# Patient Record
Sex: Male | Born: 1980 | Race: Black or African American | Hispanic: No | Marital: Single | Smoking: Current every day smoker
Health system: Southern US, Community
[De-identification: ages and names within clinical notes are randomized; demographics above are authoritative.]

## PROBLEM LIST (undated history)

## (undated) DIAGNOSIS — R519 Headache, unspecified: Secondary | ICD-10-CM

## (undated) DIAGNOSIS — R51 Headache: Secondary | ICD-10-CM

## (undated) HISTORY — PX: OTHER SURGICAL HISTORY: SHX169

## (undated) HISTORY — DX: Headache, unspecified: R51.9

## (undated) HISTORY — DX: Headache: R51

---

## 2013-10-18 ENCOUNTER — Encounter (HOSPITAL_BASED_OUTPATIENT_CLINIC_OR_DEPARTMENT_OTHER): Payer: Self-pay | Admitting: Emergency Medicine

## 2013-10-18 DIAGNOSIS — R300 Dysuria: Secondary | ICD-10-CM | POA: Insufficient documentation

## 2013-10-18 DIAGNOSIS — F17200 Nicotine dependence, unspecified, uncomplicated: Secondary | ICD-10-CM | POA: Insufficient documentation

## 2013-10-18 DIAGNOSIS — Z202 Contact with and (suspected) exposure to infections with a predominantly sexual mode of transmission: Secondary | ICD-10-CM | POA: Insufficient documentation

## 2013-10-18 NOTE — ED Notes (Signed)
Reports "std" symptoms, pain with urination, milky discharge

## 2013-10-19 ENCOUNTER — Emergency Department (HOSPITAL_BASED_OUTPATIENT_CLINIC_OR_DEPARTMENT_OTHER)
Admission: EM | Admit: 2013-10-19 | Discharge: 2013-10-19 | Disposition: A | Discharge status: Home / Self Care | Payer: Commercial Managed Care - PPO | Attending: Emergency Medicine | Admitting: Emergency Medicine

## 2013-10-19 DIAGNOSIS — Z202 Contact with and (suspected) exposure to infections with a predominantly sexual mode of transmission: Secondary | ICD-10-CM

## 2013-10-19 DIAGNOSIS — R300 Dysuria: Secondary | ICD-10-CM

## 2013-10-19 LAB — URINE MICROSCOPIC-ADD ON

## 2013-10-19 LAB — URINALYSIS, ROUTINE W REFLEX MICROSCOPIC
BILIRUBIN URINE: NEGATIVE
Glucose, UA: NEGATIVE mg/dL
Ketones, ur: NEGATIVE mg/dL
Nitrite: NEGATIVE
PROTEIN: NEGATIVE mg/dL
Specific Gravity, Urine: 1.033 — ABNORMAL HIGH (ref 1.005–1.030)
UROBILINOGEN UA: 1 mg/dL (ref 0.0–1.0)
pH: 6 (ref 5.0–8.0)

## 2013-10-19 LAB — GC/CHLAMYDIA PROBE AMP
CT Probe RNA: NEGATIVE
GC Probe RNA: NEGATIVE

## 2013-10-19 MED ORDER — AZITHROMYCIN 250 MG PO TABS
ORAL_TABLET | ORAL | Status: AC
  Filled 2013-10-19: qty 4

## 2013-10-19 MED ORDER — AZITHROMYCIN 250 MG PO TABS
1000.0000 mg | ORAL_TABLET | Freq: Once | ORAL | Status: AC
  Administered 2013-10-19: 1000 mg via ORAL

## 2013-10-19 MED ORDER — CEFTRIAXONE SODIUM 250 MG IJ SOLR
250.0000 mg | Freq: Once | INTRAMUSCULAR | Status: AC
  Administered 2013-10-19: 250 mg via INTRAMUSCULAR

## 2013-10-19 MED ORDER — CEFTRIAXONE SODIUM 250 MG IJ SOLR
INTRAMUSCULAR | Status: AC
  Filled 2013-10-19: qty 250

## 2013-10-19 NOTE — Discharge Instructions (Signed)
We will call you if your cultures indicate that you require further treatment.  Return to the emergency department if you develop worsening or different and concerning symptoms.   Dysuria Dysuria is the medical term for pain with urination. There are many causes for dysuria, but urinary tract infection is the most common. If a urinalysis was performed it can show that there is a urinary tract infection. A urine culture confirms that you or your child is sick. You will need to follow up with a healthcare provider because:  If a urine culture was done you will need to know the culture results and treatment recommendations.  If the urine culture was positive, you or your child will need to be put on antibiotics or know if the antibiotics prescribed are the right antibiotics for your urinary tract infection.  If the urine culture is negative (no urinary tract infection), then other causes may need to be explored or antibiotics need to be stopped. Today laboratory work may have been done and there does not seem to be an infection. If cultures were done they will take at least 24 to 48 hours to be completed. Today x-rays may have been taken and they read as normal. No cause can be found for the problems. The x-rays may be re-read by a radiologist and you will be contacted if additional findings are made. You or your child may have been put on medications to help with this problem until you can see your primary caregiver. If the problems get better, see your primary caregiver if the problems return. If you were given antibiotics (medications which kill germs), take all of the mediations as directed for the full course of treatment.  If laboratory work was done, you need to find the results. Leave a telephone number where you can be reached. If this is not possible, make sure you find out how you are to get test results. HOME CARE INSTRUCTIONS   Drink lots of fluids. For adults, drink eight, 8 ounce  glasses of clear juice or water a day. For children, replace fluids as suggested by your caregiver.  Empty the bladder often. Avoid holding urine for long periods of time.  After a bowel movement, women should cleanse front to back, using each tissue only once.  Empty your bladder before and after sexual intercourse.  Take all the medicine given to you until it is gone. You may feel better in a few days, but TAKE ALL MEDICINE.  Avoid caffeine, tea, alcohol and carbonated beverages, because they tend to irritate the bladder.  In men, alcohol may irritate the prostate.  Only take over-the-counter or prescription medicines for pain, discomfort, or fever as directed by your caregiver.  If your caregiver has given you a follow-up appointment, it is very important to keep that appointment. Not keeping the appointment could result in a chronic or permanent injury, pain, and disability. If there is any problem keeping the appointment, you must call back to this facility for assistance. SEEK IMMEDIATE MEDICAL CARE IF:   Back pain develops.  A fever develops.  There is nausea (feeling sick to your stomach) or vomiting (throwing up).  Problems are no better with medications or are getting worse. MAKE SURE YOU:   Understand these instructions.  Will watch your condition.  Will get help right away if you are not doing well or get worse. Document Released: 01/09/2004 Document Revised: 07/05/2011 Document Reviewed: 11/16/2007 [facility] Patient Information 2015 [place], [place]. This information is  not intended to replace advice given to you by your health care provider. Make sure you discuss any questions you have with your health care provider. ° °

## 2013-10-19 NOTE — ED Provider Notes (Signed)
CSN: [id number]     Arrival date & time 10/18/13  2316 History   First MD Initiated Contact with Patient 10/19/13 0102     Chief Complaint  Patient presents with  . SEXUALLY TRANSMITTED DISEASE     (Consider location/radiation/quality/duration/timing/severity/associated sxs/prior Treatment) HPI Comments: Patient presents with complaints of burning on urination and urethral discharge. He recently engaged in sexual intercourse with a new partner which was not protected. He denies any fevers or chills. He denies any abdominal pain. Symptoms are worse with urinating and there are no alleviating factors.  The history is provided by the patient.    History reviewed. No pertinent past medical history. History reviewed. No pertinent past surgical history. History reviewed. No pertinent family history. History  Substance Use Topics  . Smoking status: Current Every Day Smoker -- 0.50 packs/day    Types: Cigarettes  . Smokeless tobacco: Not on file  . Alcohol Use: Yes     Comment: occasionally    Review of Systems  All other systems reviewed and are negative.     Allergies  Review of patient's allergies indicates no known allergies.  Home Medications   Prior to Admission medications   Not on File   BP 138/75  Pulse 74  Temp(Src) 98.2 F (36.8 C) (Oral)  Resp 16  Ht 6\' 4"  (1.93 m)  Wt 207 lb (93.895 kg)  BMI 25.21 kg/m2  SpO2 98% Physical Exam  Nursing note and vitals reviewed. Constitutional: He is oriented to person, place, and time. He appears well-developed and well-nourished. No distress.  HENT:  Head: Normocephalic and atraumatic.  Neck: Normal range of motion. Neck supple.  Genitourinary:  There is a slight whitish urethral discharge present. There are no vesicular lesions or other external abnormalities.  Neurological: He is alert and oriented to person, place, and time.  Skin: Skin is warm and dry. He is not diaphoretic.    ED Course  Procedures (including  critical care time) Labs Review Labs Reviewed  URINALYSIS, ROUTINE W REFLEX MICROSCOPIC - Abnormal; Notable for the following:    Specific Gravity, Urine 1.033 (*)    Hgb urine dipstick TRACE (*)    Leukocytes, UA SMALL (*)    All other components within normal limits  GC/CHLAMYDIA PROBE AMP  URINE MICROSCOPIC-ADD ON    Imaging Review No results found.   EKG Interpretation None      MDM   Final diagnoses:  STD exposure  Dysuria    Tests for GC and Chlamydia were sent to the laboratory. Will treat presumptively for STD with Rocephin and Zithromax. He understands to return if his symptoms worsen or change.    Geoffery Lyonsouglas Delo, MD 10/19/13 [phone]

## 2013-11-19 ENCOUNTER — Encounter (HOSPITAL_BASED_OUTPATIENT_CLINIC_OR_DEPARTMENT_OTHER): Payer: Self-pay | Admitting: Emergency Medicine

## 2013-11-19 ENCOUNTER — Emergency Department (HOSPITAL_BASED_OUTPATIENT_CLINIC_OR_DEPARTMENT_OTHER)
Admission: EM | Admit: 2013-11-19 | Discharge: 2013-11-20 | Disposition: A | Discharge status: Home / Self Care | Payer: Commercial Managed Care - PPO | Attending: Emergency Medicine | Admitting: Emergency Medicine

## 2013-11-19 DIAGNOSIS — R369 Urethral discharge, unspecified: Secondary | ICD-10-CM | POA: Insufficient documentation

## 2013-11-19 DIAGNOSIS — F17200 Nicotine dependence, unspecified, uncomplicated: Secondary | ICD-10-CM | POA: Insufficient documentation

## 2013-11-19 DIAGNOSIS — A64 Unspecified sexually transmitted disease: Secondary | ICD-10-CM | POA: Diagnosis not present

## 2013-11-19 MED ORDER — CEFTRIAXONE SODIUM 250 MG IJ SOLR
250.0000 mg | Freq: Once | INTRAMUSCULAR | Status: AC
  Administered 2013-11-20: 250 mg via INTRAMUSCULAR
  Filled 2013-11-19: qty 250

## 2013-11-19 MED ORDER — LIDOCAINE HCL (PF) 1 % IJ SOLN
INTRAMUSCULAR | Status: AC
  Administered 2013-11-20: 1.2 mL
  Filled 2013-11-19: qty 5

## 2013-11-19 MED ORDER — METRONIDAZOLE 500 MG PO TABS
2000.0000 mg | ORAL_TABLET | Freq: Once | ORAL | Status: AC
  Administered 2013-11-20: 2000 mg via ORAL
  Filled 2013-11-19: qty 4

## 2013-11-19 MED ORDER — AZITHROMYCIN 1 G PO PACK
1.0000 g | PACK | Freq: Once | ORAL | Status: AC
  Administered 2013-11-20: 1 g via ORAL
  Filled 2013-11-19: qty 1

## 2013-11-19 NOTE — ED Notes (Signed)
Pt c/o penile discharge x 1 month seen here and tx for same x 2 month ago

## 2013-11-19 NOTE — ED Provider Notes (Signed)
CSN: [id number]     Arrival date & time 11/19/13  2237 History   This chart was scribed for Stephen Finley Smitty Cords, MD by Milly Jakob, ED Scribe. The patient was seen in room MH03/MH03. Patient's care was started at 12:00 PM.   Chief Complaint  Patient presents with  . Penile Discharge   Patient is a 33 y.o. male presenting with penile discharge. The history is provided by the patient. No language interpreter was used.  Penile Discharge This is a new problem. The current episode started 2 days ago. The problem has not changed since onset.Pertinent negatives include no chest pain, no abdominal pain, no headaches and no shortness of breath. Nothing aggravates the symptoms. Nothing relieves the symptoms. He has tried nothing for the symptoms. The treatment provided no relief.    History reviewed. No pertinent past medical history. History reviewed. No pertinent past surgical history. History reviewed. No pertinent family history. History  Substance Use Topics  . Smoking status: Current Every Day Smoker -- 0.50 packs/day    Types: Cigarettes  . Smokeless tobacco: Not on file  . Alcohol Use: Yes     Comment: occasionally    Review of Systems  Constitutional: Negative for fever.  Respiratory: Negative for shortness of breath.   Cardiovascular: Negative for chest pain.  Gastrointestinal: Negative for abdominal pain.  Genitourinary: Positive for discharge. Negative for dysuria.  Neurological: Negative for headaches.  All other systems reviewed and are negative.     Allergies  Review of patient's allergies indicates no known allergies.  Home Medications   Prior to Admission medications   Not on File   Triage Vitals: BP 122/90  Pulse 77  Temp(Src) 98.4 F (36.9 C) (Oral)  Resp 16  Ht 6\' 4"  (1.93 m)  Wt 207 lb (93.895 kg)  BMI 25.21 kg/m2  SpO2 100% Physical Exam  Nursing note and vitals reviewed. Constitutional: He is oriented to person, place, and time. He appears  well-developed and well-nourished. No distress.  HENT:  Head: Normocephalic and atraumatic.  Mouth/Throat: Oropharynx is clear and moist and mucous membranes are normal. No oropharyngeal exudate.  No exudate.   Eyes: Conjunctivae and EOM are normal. Pupils are equal, round, and reactive to light.  Neck: Normal range of motion. Neck supple. No tracheal deviation present.  Cardiovascular: Normal rate, regular rhythm and normal heart sounds.   Pulmonary/Chest: Effort normal and breath sounds normal. No respiratory distress. He has no wheezes. He has no rales.  Abdominal: Soft. Bowel sounds are normal. He exhibits no distension. There is no tenderness. There is no rebound and no guarding.  Genitourinary:  Chaperone present. Yellow penile discharge.   Musculoskeletal: Normal range of motion.  Neurological: He is alert and oriented to person, place, and time.  Skin: Skin is warm and dry.  Psychiatric: He has a normal mood and affect. His behavior is normal.    ED Course  Procedures (including critical care time) DIAGNOSTIC STUDIES: Oxygen Saturation is 100% on room air, normal by my interpretation.    COORDINATION OF CARE: 12:07 PM-Discussed treatment plan with pt at bedside and pt agreed to plan.   Labs Review Labs Reviewed  GC/CHLAMYDIA PROBE AMP    Imaging Review No results found.   EKG Interpretation None      MDM   Final diagnoses:  None   [facility] [place] [facility]. No sexual activity for 7 days and until all partners are treated. Pt  verbalizes understanding and agrees to follow up.     I personally performed the services described in this documentation, which was scribed in my presence. The recorded information has been reviewed and is accurate.    Jasmine AweApril K Mairely Foxworth-Rasch, MD 11/20/13 [phone]

## 2013-11-20 ENCOUNTER — Encounter (HOSPITAL_BASED_OUTPATIENT_CLINIC_OR_DEPARTMENT_OTHER): Payer: Self-pay | Admitting: Emergency Medicine

## 2013-11-20 NOTE — ED Notes (Signed)
MD at bedside. 

## 2013-11-20 NOTE — Discharge Instructions (Signed)
Sexually Transmitted Disease A sexually transmitted disease (STD) is a disease or infection often passed to another person during sex. However, STDs can be passed through nonsexual ways. An STD can be passed through:  Spit (saliva).  Semen.  Blood.  Mucus from the vagina.  Pee (urine). HOW CAN I LESSEN MY CHANCES OF GETTING AN STD?  Use:  Latex condoms.  Water-soluble lubricants with condoms. Do not use petroleum jelly or oils.  Dental dams. These are small pieces of latex that are used as a barrier during oral sex.  Avoid having more than one sex partner.  Do not have sex with someone who has other sex partners.  Do not have sex with anyone you do not know or who is at high risk for an STD.  Avoid risky sex that can break your skin.  Do not have sex if you have open sores on your mouth or skin.  Avoid drinking too much alcohol or taking illegal drugs. Alcohol and drugs can affect your good judgment.  Avoid oral and anal sex acts.  Get shots (vaccines) for HPV and hepatitis.  If you are at risk of being infected with HIV, it is advised that you take a certain medicine daily to prevent HIV infection. This is called pre-exposure prophylaxis (PrEP). You may be at risk if:  You are a man who has sex with other men (MSM).  You are attracted to the opposite sex (heterosexual) and are having sex with more than one partner.  You take drugs with a needle.  You have sex with someone who has HIV.  Talk with your doctor about if you are at high risk of being infected with HIV. If you begin to take PrEP, get tested for HIV first. Get tested every 3 months for as long as you are taking PrEP. WHAT SHOULD I DO IF I THINK I HAVE AN STD?  See your doctor.  Tell your sex partner(s) that you have an STD. They should be tested and treated.  Do not have sex until your doctor says it is okay. WHEN SHOULD I GET HELP? Get help right away if:  You have bad belly (abdominal)  pain.  You are a man and have puffiness (swelling) or pain in your testicles.  You are a woman and have puffiness in your vagina. Document Released: 05/20/2004 Document Revised: 04/17/2013 Document Reviewed: 10/06/2012 ExitCare Patient Information 2015 [employer]. This information is not intended to replace advice given to you by your health care provider. Make sure you discuss any questions you have with your health care provider.  

## 2013-11-21 LAB — GC/CHLAMYDIA PROBE AMP
CT PROBE, AMP APTIMA: NEGATIVE
GC PROBE AMP APTIMA: NEGATIVE

## 2015-03-22 ENCOUNTER — Encounter (HOSPITAL_BASED_OUTPATIENT_CLINIC_OR_DEPARTMENT_OTHER): Payer: Self-pay | Admitting: Emergency Medicine

## 2015-03-22 ENCOUNTER — Emergency Department (HOSPITAL_BASED_OUTPATIENT_CLINIC_OR_DEPARTMENT_OTHER)
Admission: EM | Admit: 2015-03-22 | Discharge: 2015-03-22 | Disposition: A | Discharge status: Home / Self Care | Payer: Commercial Managed Care - PPO | Attending: Emergency Medicine | Admitting: Emergency Medicine

## 2015-03-22 DIAGNOSIS — W25XXXA Contact with sharp glass, initial encounter: Secondary | ICD-10-CM | POA: Insufficient documentation

## 2015-03-22 DIAGNOSIS — Y9389 Activity, other specified: Secondary | ICD-10-CM | POA: Insufficient documentation

## 2015-03-22 DIAGNOSIS — F17210 Nicotine dependence, cigarettes, uncomplicated: Secondary | ICD-10-CM | POA: Insufficient documentation

## 2015-03-22 DIAGNOSIS — S61219A Laceration without foreign body of unspecified finger without damage to nail, initial encounter: Secondary | ICD-10-CM

## 2015-03-22 DIAGNOSIS — Y998 Other external cause status: Secondary | ICD-10-CM | POA: Insufficient documentation

## 2015-03-22 DIAGNOSIS — Y9289 Other specified places as the place of occurrence of the external cause: Secondary | ICD-10-CM | POA: Insufficient documentation

## 2015-03-22 DIAGNOSIS — S61214A Laceration without foreign body of right ring finger without damage to nail, initial encounter: Secondary | ICD-10-CM | POA: Insufficient documentation

## 2015-03-22 MED ORDER — AMOXICILLIN-POT CLAVULANATE 875-125 MG PO TABS: 1.0000 | ORAL_TABLET | Freq: Two times a day (BID) | ORAL | Status: DC

## 2015-03-22 MED ORDER — LIDOCAINE HCL 2 % IJ SOLN
20.0000 mL | Freq: Once | INTRAMUSCULAR | Status: AC
  Administered 2015-03-22: 400 mg via INTRADERMAL
  Filled 2015-03-22: qty 20

## 2015-03-22 NOTE — ED Provider Notes (Signed)
CSN: [id number]     Arrival date & time 03/22/15  1510 History   First MD Initiated Contact with Patient 03/22/15 1544     Chief Complaint  Patient presents with  . Laceration     (Consider location/radiation/quality/duration/timing/severity/associated sxs/prior Treatment) HPI Comments: Patient presents with complaint of laceration to his right ring finger sustained while washing dishes. Patient cut his hand on broken glass. Patient rinsed the wound and bandage prior to arrival. No other treatments. Last tetanus within 10 years. Onset of symptoms acute. Course is constant. Nothing makes symptoms better or worse.  The history is provided by the patient.    History reviewed. No pertinent past medical history. History reviewed. No pertinent past surgical history. History reviewed. No pertinent family history. Social History  Substance Use Topics  . Smoking status: Current Every Day Smoker -- 0.50 packs/day    Types: Cigarettes  . Smokeless tobacco: None  . Alcohol Use: Yes     Comment: occasionally    Review of Systems  Constitutional: Negative for activity change.  Musculoskeletal: Positive for myalgias. Negative for back pain, joint swelling, arthralgias, gait problem and neck pain.  Skin: Positive for wound.  Neurological: Negative for weakness and numbness.    Allergies  Review of patient's allergies indicates no known allergies.  Home Medications   Prior to Admission medications   Not on File   BP 148/87 mmHg  Pulse 74  Temp(Src) 98.4 F (36.9 C) (Oral)  Resp 18  Ht  (1.93 m)  Wt 90.719 kg  BMI 24.35 kg/m2  SpO2 99%   Physical Exam  Constitutional: He appears well-developed and well-nourished.  HENT:  Head: Normocephalic and atraumatic.  Eyes: Conjunctivae are normal.  Neck: Normal range of motion. Neck supple.  Cardiovascular: Normal pulses.   Musculoskeletal: He exhibits tenderness. He exhibits no edema.       Right elbow: Normal.      Right  wrist: Normal.       Right forearm: Normal.       Right hand: He exhibits tenderness. He exhibits normal range of motion. Normal sensation noted.       Hands: 3 cm, irregular, C-shaped laceration noted over the dorsum of the right fourth digit overlying the DIP joint.  Neurological: He is alert. No sensory deficit.  Motor, sensation, and vascular distal to the injury is fully intact. 5 out of 5 strength in flexion and extension of the finger at the DIP, PIP, MCP.  Skin: Skin is warm and dry.  Psychiatric: He has a normal mood and affect.  Nursing note and vitals reviewed.   ED Course  Procedures (including critical care time) Labs Review Labs Reviewed - No data to display  Imaging Review No results found. I have personally reviewed and evaluated these images and lab results as part of my medical decision-making.   EKG Interpretation None      5:12 PM Patient seen and examined.   Vital signs reviewed and are as follows: BP 148/87 mmHg  Pulse 74  Temp(Src) 98.4 F (36.9 C) (Oral)  Resp 18  Ht  (1.93 m)  Wt 90.719 kg  BMI 24.35 kg/m2  SpO2 99%  Wound base explored. No foreign bodies or tendon injury observed. Patient agrees to proceed with laceration repair.  LACERATION REPAIR Performed by: Carolee Rota Authorized by: Carolee Rota Consent: Verbal consent obtained. Risks and benefits: risks, benefits and alternatives were discussed Consent given by: patient Patient identity confirmed: provided demographic data Prepped  and Draped in normal sterile fashion Wound explored  Laceration Location: r ring finger  Laceration Length: 3cm  No Foreign Bodies seen or palpated  Anesthesia: local infiltration  Local anesthetic: lidocaine 2% without epinephrine  Anesthetic total: 5 ml  Irrigation method: skin scrub with dermal cleanser Amount of cleaning: standard  Skin closure: 5-0 Ethilon  Number of sutures: 8  Technique: Simple interrupted   Patient  tolerance: Patient tolerated the procedure well with no immediate complications.  Patient provided with a splint. Prescription for Augmentin 3 days given his hand laceration.  5:13 PM Patient counseled on wound care. Patient counseled on need to return or see PCP/urgent care for suture removal in 10 days. Patient was urged to return to the Emergency Department urgently with worsening pain, swelling, expanding erythema especially if it streaks away from the affected area, fever, or if they have any other concerns. Patient verbalized understanding.     MDM   Final diagnoses:  Laceration of finger, initial encounter   Patient with finger laceration. No evidence of tendon injury. Patient has normal strength in extension. Wound appeared without complication. Tetanus is up-to-date.   Renne CriglerJoshua Kavya Haag, PA-C 03/22/15 1715  Mirian MoMatthew Gentry, MD 03/22/15 [phone]

## 2015-03-22 NOTE — ED Notes (Signed)
Pt states that he was washing dishes and cut finger on broken glass, currently wrapped with kerlix and secured bleeding controlled, nad

## 2015-03-22 NOTE — ED Notes (Signed)
Arrived with pressure bandage in place, removed in exam room, noted to be an irregular laceration, bleeding noted, pressure applied

## 2015-03-22 NOTE — ED Notes (Signed)
PA-C in room with pt

## 2015-03-22 NOTE — Discharge Instructions (Signed)
Please read and follow all provided instructions.  Your diagnoses today include:  1. Laceration of finger, initial encounter    Tests performed today include:  Vital signs. See below for your results today.   Medications prescribed:   Augmentin - antibiotic  You have been prescribed an antibiotic medicine: take the entire course of medicine even if you are feeling better. Stopping early can cause the antibiotic not to work.  Take any prescribed medications only as directed.   Home care instructions:  Follow any educational materials and wound care instructions contained in this packet.   Keep affected area above the level of your heart when possible to minimize swelling. Wash area gently twice a day with warm soapy water. Do not apply alcohol or hydrogen peroxide. Cover the area if it draining or weeping.   Follow-up instructions: Suture Removal: Return to the Emergency Department or see your primary care care doctor in 10 days for a recheck of your wound and removal of your sutures or staples.    Return instructions:  Return to the Emergency Department if you have:  Fever  Worsening pain  Worsening swelling of the wound  Pus draining from the wound  Redness of the skin that moves away from the wound, especially if it streaks away from the affected area   Any other emergent concerns  Your vital signs today were: BP 148/87 mmHg   Pulse 74   Temp(Src) 98.4 F (36.9 C) (Oral)   Resp 18   Ht 6\' 4"  (1.93 m)   Wt 90.719 kg   BMI 24.35 kg/m2   SpO2 99% If your blood pressure (BP) was elevated above 135/85 this visit, please have this repeated by your doctor within one month. --------------

## 2015-03-22 NOTE — ED Notes (Signed)
Pt teaching done re: observing for S&S of infection, pain control, keeping dsg CD and I. Also discussed taking and completing abx as prescribed by EDP, opportunity for questions provided

## 2015-03-22 NOTE — ED Notes (Signed)
Splint applied to rt ring finger by EMT-P,

## 2015-03-22 NOTE — ED Notes (Signed)
States cut rt ring finger at bend area on glass while washing dishes. Unsure of tetanus status

## 2015-04-01 ENCOUNTER — Emergency Department (HOSPITAL_BASED_OUTPATIENT_CLINIC_OR_DEPARTMENT_OTHER)
Admission: EM | Admit: 2015-04-01 | Discharge: 2015-04-01 | Disposition: A | Discharge status: Home / Self Care | Payer: Commercial Managed Care - PPO | Attending: Emergency Medicine | Admitting: Emergency Medicine

## 2015-04-01 ENCOUNTER — Encounter (HOSPITAL_BASED_OUTPATIENT_CLINIC_OR_DEPARTMENT_OTHER): Payer: Self-pay | Admitting: *Deleted

## 2015-04-01 DIAGNOSIS — F17210 Nicotine dependence, cigarettes, uncomplicated: Secondary | ICD-10-CM | POA: Insufficient documentation

## 2015-04-01 DIAGNOSIS — Z4802 Encounter for removal of sutures: Secondary | ICD-10-CM

## 2015-04-01 NOTE — ED Provider Notes (Signed)
CSN: [id number]     Arrival date & time 04/01/15  1750 History   First MD Initiated Contact with Patient 04/01/15 1757     Chief Complaint  Patient presents with  . Suture / Staple Removal     (Consider location/radiation/quality/duration/timing/severity/associated sxs/prior Treatment) Patient is a 34 y.o. male presenting with suture removal. The history is provided by the patient and medical records. No language interpreter was used.  Suture / Staple Removal Pertinent negatives include no fever, nausea, numbness, vomiting or weakness.     Stephen Finley is a 34 y.o. male  with no major medical history presents to the Emergency Department for suture removal from a healing laceration to the right ring finger sustained while washing dishes. Record review shows the patient was seen on 03/22/2015 with a 3 cm, irregular laceration. 8 sutures were placed at that time.  Patient's tetanus was up-to-date at that time. He denies fever, chills, erythema, drainage, pain, numbness, weakness.   History reviewed. No pertinent past medical history. History reviewed. No pertinent past surgical history. No family history on file. Social History  Substance Use Topics  . Smoking status: Current Every Day Smoker -- 0.50 packs/day    Types: Cigarettes  . Smokeless tobacco: None  . Alcohol Use: Yes     Comment: occasionally    Review of Systems  Constitutional: Negative for fever.  Gastrointestinal: Negative for nausea and vomiting.  Skin: Positive for wound.  Allergic/Immunologic: Negative for immunocompromised state.  Neurological: Negative for weakness and numbness.  Hematological: Does not bruise/bleed easily.  Psychiatric/Behavioral: The patient is not nervous/anxious.       Allergies  Review of patient's allergies indicates no known allergies.  Home Medications   Prior to Admission medications   Medication Sig Start Date End Date Taking? Authorizing Provider  amoxicillin-clavulanate  (AUGMENTIN) 875-125 MG tablet Take 1 tablet by mouth every 12 (twelve) hours. 03/22/15   Renne Crigler, PA-C   BP 148/80 mmHg  Pulse 70  Temp(Src) 98.2 F (36.8 C) (Oral)  Resp 20  Ht  (1.93 m)  Wt 90.719 kg  BMI 24.35 kg/m2  SpO2 94% Physical Exam  Constitutional: He appears well-developed and well-nourished. No distress.  HENT:  Head: Normocephalic and atraumatic.  Eyes: Conjunctivae are normal.  Neck: Normal range of motion.  Cardiovascular: Normal rate and intact distal pulses.   Capillary refill < 3 sec  Pulmonary/Chest: Effort normal.  Musculoskeletal: Normal range of motion.  Neurological: He is alert.  Skin: Skin is warm and dry.  Wound well approximated and healing well on the right ring finger 8 sutures/staples in place No erythema, induration or purulent drainage  Psychiatric: He has a normal mood and affect.    ED Course  Procedures (including critical care time) Labs Review Labs Reviewed - No data to display  Imaging Review No results found. I have personally reviewed and evaluated these images and lab results as part of my medical decision-making.   EKG Interpretation None      MDM   Final diagnoses:  Visit for suture removal   [name] presents for suture removal and wound check as above. Procedure tolerated well. Vitals normal, no signs of infection. Scar minimization & return precautions given at dc.   BP 148/80 mmHg  Pulse 70  Temp(Src) 98.2 F (36.8 C) (Oral)  Resp 20  Ht  (1.93 m)  Wt 90.719 kg  BMI 24.35 kg/m2  SpO2 94%     Dierdre Forth, PA-C 04/01/15 1833  Doug SouSam Jacubowitz, MD 04/02/15 0005

## 2015-04-01 NOTE — Discharge Instructions (Signed)

## 2015-04-01 NOTE — ED Notes (Signed)
Here for suture removal from his right 4th digit. Sutures x 8 intact.

## 2015-05-11 ENCOUNTER — Emergency Department (HOSPITAL_BASED_OUTPATIENT_CLINIC_OR_DEPARTMENT_OTHER)
Admission: EM | Admit: 2015-05-11 | Discharge: 2015-05-11 | Disposition: A | Discharge status: Home / Self Care | Payer: Commercial Managed Care - PPO | Attending: Emergency Medicine | Admitting: Emergency Medicine

## 2015-05-11 ENCOUNTER — Encounter (HOSPITAL_BASED_OUTPATIENT_CLINIC_OR_DEPARTMENT_OTHER): Payer: Self-pay | Admitting: Emergency Medicine

## 2015-05-11 DIAGNOSIS — R1084 Generalized abdominal pain: Secondary | ICD-10-CM

## 2015-05-11 DIAGNOSIS — R319 Hematuria, unspecified: Secondary | ICD-10-CM | POA: Insufficient documentation

## 2015-05-11 DIAGNOSIS — K219 Gastro-esophageal reflux disease without esophagitis: Secondary | ICD-10-CM

## 2015-05-11 DIAGNOSIS — F17210 Nicotine dependence, cigarettes, uncomplicated: Secondary | ICD-10-CM | POA: Insufficient documentation

## 2015-05-11 LAB — COMPREHENSIVE METABOLIC PANEL
ALBUMIN: 4.3 g/dL (ref 3.5–5.0)
ALT: 15 U/L — AB (ref 17–63)
AST: 20 U/L (ref 15–41)
Alkaline Phosphatase: 97 U/L (ref 38–126)
Anion gap: 6 (ref 5–15)
BUN: 14 mg/dL (ref 6–20)
CHLORIDE: 107 mmol/L (ref 101–111)
CO2: 24 mmol/L (ref 22–32)
CREATININE: 1.26 mg/dL — AB (ref 0.61–1.24)
Calcium: 9.2 mg/dL (ref 8.9–10.3)
GFR calc Af Amer: >60 mL/min (ref >60)
GFR calc non Af Amer: >60 mL/min (ref >60)
GLUCOSE: 95 mg/dL (ref 65–99)
Potassium: 3.5 mmol/L (ref 3.5–5.1)
SODIUM: 137 mmol/L (ref 135–145)
Total Bilirubin: 0.6 mg/dL (ref 0.3–1.2)
Total Protein: 7.8 g/dL (ref 6.5–8.1)

## 2015-05-11 LAB — LIPASE, BLOOD: Lipase: 27 U/L (ref 11–51)

## 2015-05-11 LAB — CBC WITH DIFFERENTIAL/PLATELET
BASOS ABS: 0 10*3/uL (ref 0.0–0.1)
BASOS PCT: 0 %
EOS ABS: 0.1 10*3/uL (ref 0.0–0.7)
EOS PCT: 2 %
HCT: 48.6 % (ref 39.0–52.0)
Hemoglobin: 16.4 g/dL (ref 13.0–17.0)
Lymphocytes Relative: 32 %
Lymphs Abs: 2.1 10*3/uL (ref 0.7–4.0)
MCH: 30.1 pg (ref 26.0–34.0)
MCHC: 33.7 g/dL (ref 30.0–36.0)
MCV: 89.2 fL (ref 78.0–100.0)
Monocytes Absolute: 1.4 10*3/uL — ABNORMAL HIGH (ref 0.1–1.0)
Monocytes Relative: 21 %
Neutro Abs: 2.9 10*3/uL (ref 1.7–7.7)
Neutrophils Relative %: 45 %
PLATELETS: 167 10*3/uL (ref 150–400)
RBC: 5.45 MIL/uL (ref 4.22–5.81)
RDW: 12.3 % (ref 11.5–15.5)
WBC: 6.5 10*3/uL (ref 4.0–10.5)

## 2015-05-11 LAB — URINALYSIS, ROUTINE W REFLEX MICROSCOPIC
Glucose, UA: NEGATIVE mg/dL
Ketones, ur: NEGATIVE mg/dL
Leukocytes, UA: NEGATIVE
Nitrite: NEGATIVE
PH: 6 (ref 5.0–8.0)
Protein, ur: NEGATIVE mg/dL
SPECIFIC GRAVITY, URINE: 1.036 — AB (ref 1.005–1.030)

## 2015-05-11 LAB — URINE MICROSCOPIC-ADD ON

## 2015-05-11 MED ORDER — SUCRALFATE 1 G PO TABS: 1.0000 g | ORAL_TABLET | Freq: Three times a day (TID) | ORAL | Status: DC

## 2015-05-11 MED ORDER — SODIUM CHLORIDE 0.9 % IV BOLUS (SEPSIS)
1000.0000 mL | Freq: Once | INTRAVENOUS | Status: AC
  Administered 2015-05-11: 1000 mL via INTRAVENOUS

## 2015-05-11 MED ORDER — GI COCKTAIL ~~LOC~~
30.0000 mL | Freq: Once | ORAL | Status: AC
  Administered 2015-05-11: 30 mL via ORAL
  Filled 2015-05-11: qty 30

## 2015-05-11 MED ORDER — LOPERAMIDE HCL 2 MG PO CAPS
2.0000 mg | ORAL_CAPSULE | Freq: Once | ORAL | Status: AC
  Administered 2015-05-11: 2 mg via ORAL
  Filled 2015-05-11: qty 1

## 2015-05-11 MED ORDER — PANTOPRAZOLE SODIUM 40 MG IV SOLR
40.0000 mg | Freq: Once | INTRAVENOUS | Status: AC
  Administered 2015-05-11: 40 mg via INTRAVENOUS
  Filled 2015-05-11: qty 40

## 2015-05-11 MED ORDER — PANTOPRAZOLE SODIUM 40 MG PO TBEC: 40.0000 mg | DELAYED_RELEASE_TABLET | Freq: Every day | ORAL | Status: DC

## 2015-05-11 MED ORDER — ONDANSETRON HCL 4 MG/2ML IJ SOLN
4.0000 mg | Freq: Once | INTRAMUSCULAR | Status: AC
  Administered 2015-05-11: 4 mg via INTRAVENOUS
  Filled 2015-05-11: qty 2

## 2015-05-11 MED ORDER — LOPERAMIDE HCL 2 MG PO CAPS: 2.0000 mg | ORAL_CAPSULE | Freq: Four times a day (QID) | ORAL | Status: DC | PRN

## 2015-05-11 MED ORDER — ONDANSETRON 4 MG PO TBDP: 4.0000 mg | ORAL_TABLET | Freq: Three times a day (TID) | ORAL | Status: DC | PRN

## 2015-05-11 NOTE — Discharge Instructions (Signed)
Food Choices for Gastroesophageal Reflux Disease, Adult °When you have gastroesophageal reflux disease (GERD), the foods you eat and your eating habits are very important. Choosing the right foods can help ease the discomfort of GERD. °WHAT GENERAL GUIDELINES DO I NEED TO FOLLOW? °· Choose fruits, vegetables, whole grains, low-fat dairy products, and low-fat meat, fish, and poultry. °· Limit fats such as oils, salad dressings, butter, nuts, and avocado. °· Keep a food diary to identify foods that cause symptoms. °· Avoid foods that cause reflux. These may be different for different people. °· Eat frequent small meals instead of three large meals each day. °· Eat your meals slowly, in a relaxed setting. °· Limit fried foods. °· Cook foods using methods other than frying. °· Avoid drinking alcohol. °· Avoid drinking large amounts of liquids with your meals. °· Avoid bending over or lying down until 2-3 hours after eating. °WHAT FOODS ARE NOT RECOMMENDED? °The following are some foods and drinks that may worsen your symptoms: °Vegetables °Tomatoes. Tomato juice. Tomato and spaghetti sauce. Chili peppers. Onion and garlic. Horseradish. °Fruits °Oranges, grapefruit, and lemon (fruit and juice). °Meats °High-fat meats, fish, and poultry. This includes hot dogs, ribs, ham, sausage, salami, and bacon. °Dairy °Whole milk and chocolate milk. Sour cream. Cream. Butter. Ice cream. Cream cheese.  °Beverages °Coffee and tea, with or without caffeine. Carbonated beverages or energy drinks. °Condiments °Hot sauce. Barbecue sauce.  °Sweets/Desserts °Chocolate and cocoa. Donuts. Peppermint and spearmint. °Fats and Oils °High-fat foods, including French fries and potato chips. °Other °Vinegar. Strong spices, such as black pepper, white pepper, red pepper, cayenne, curry powder, cloves, ginger, and chili powder. °The items listed above may not be a complete list of foods and beverages to avoid. Contact your dietitian for more  information. °  °This information is not intended to replace advice given to you by your health care provider. Make sure you discuss any questions you have with your health care provider. °  °Document Released: 04/12/2005 Document Revised: 05/03/2014 Document Reviewed: 02/14/2013 °Elsevier Interactive Patient Education ©2016 Elsevier Inc. ° °Gastroesophageal Reflux Disease, Adult °Normally, food travels down the esophagus and stays in the stomach to be digested. However, when a person has gastroesophageal reflux disease (GERD), food and stomach acid move back up into the esophagus. When this happens, the esophagus becomes sore and inflamed. Over time, GERD can create small holes (ulcers) in the lining of the esophagus.  °CAUSES °This condition is caused by a problem with the muscle between the esophagus and the stomach (lower esophageal sphincter, or LES). Normally, the LES muscle closes after food passes through the esophagus to the stomach. When the LES is weakened or abnormal, it does not close properly, and that allows food and stomach acid to go back up into the esophagus. The LES can be weakened by certain dietary substances, medicines, and medical conditions, including: °· Tobacco use. °· Pregnancy. °· Having a hiatal hernia. °· Heavy alcohol use. °· Certain foods and beverages, such as coffee, chocolate, onions, and peppermint. °RISK FACTORS °This condition is more likely to develop in: °· People who have an increased body weight. °· People who have connective tissue disorders. °· People who use NSAID medicines. °SYMPTOMS °Symptoms of this condition include: °· Heartburn. °· Difficult or painful swallowing. °· The feeling of having a lump in the throat. °· A bitter taste in the mouth. °· Bad breath. °· Having a large amount of saliva. °· Having an upset or bloated stomach. °· Belching. °· Chest pain. °·   Shortness of breath or wheezing. °· Ongoing (chronic) cough or a night-time cough. °· Wearing away of  tooth enamel. °· Weight loss. °Different conditions can cause chest pain. Make sure to see your health care provider if you experience chest pain. °DIAGNOSIS °Your health care provider will take a medical history and perform a physical exam. To determine if you have mild or severe GERD, your health care provider may also monitor how you respond to treatment. You may also have other tests, including: °· An endoscopy to examine your stomach and esophagus with a small camera. °· A test that measures the acidity level in your esophagus. °· A test that measures how much pressure is on your esophagus. °· A barium swallow or modified barium swallow to show the shape, size, and functioning of your esophagus. °TREATMENT °The goal of treatment is to help relieve your symptoms and to prevent complications. Treatment for this condition may vary depending on how severe your symptoms are. Your health care provider may recommend: °· Changes to your diet. °· Medicine. °· Surgery. °HOME CARE INSTRUCTIONS °Diet °· Follow a diet as recommended by your health care provider. This may involve avoiding foods and drinks such as: °¨ Coffee and tea (with or without caffeine). °¨ Drinks that contain alcohol. °¨ Energy drinks and sports drinks. °¨ Carbonated drinks or sodas. °¨ Chocolate and cocoa. °¨ Peppermint and mint flavorings. °¨ Garlic and onions. °¨ Horseradish. °¨ Spicy and acidic foods, including peppers, chili powder, curry powder, vinegar, hot sauces, and barbecue sauce. °¨ Citrus fruit juices and citrus fruits, such as oranges, lemons, and limes. °¨ Tomato-based foods, such as red sauce, chili, salsa, and pizza with red sauce. °¨ Fried and fatty foods, such as donuts, french fries, potato chips, and high-fat dressings. °¨ High-fat meats, such as hot dogs and fatty cuts of red and white meats, such as rib eye steak, sausage, ham, and bacon. °¨ High-fat dairy items, such as whole milk, butter, and cream cheese. °· Eat small,  frequent meals instead of large meals. °· Avoid drinking large amounts of liquid with your meals. °· Avoid eating meals during the 2-3 hours before bedtime. °· Avoid lying down right after you eat. °· Do not exercise right after you eat. ° General Instructions  °· Pay attention to any changes in your symptoms. °· Take over-the-counter and prescription medicines only as told by your health care provider. Do not take aspirin, ibuprofen, or other NSAIDs unless your health care provider told you to do so. °· Do not use any tobacco products, including cigarettes, chewing tobacco, and e-cigarettes. If you need help quitting, ask your health care provider. °· Wear loose-fitting clothing. Do not wear anything tight around your waist that causes pressure on your abdomen. °· Raise (elevate) the head of your bed 6 inches (15cm). °· Try to reduce your stress, such as with yoga or meditation. If you need help reducing stress, ask your health care provider. °· If you are overweight, reduce your weight to an amount that is healthy for you. Ask your health care provider for guidance about a safe weight loss goal. °· Keep all follow-up visits as told by your health care provider. This is important. °SEEK MEDICAL CARE IF: °· You have new symptoms. °· You have unexplained weight loss. °· You have difficulty swallowing, or it hurts to swallow. °· You have wheezing or a persistent cough. °· Your symptoms do not improve with treatment. °· You have a hoarse voice. °SEEK IMMEDIATE MEDICAL CARE IF: °· You have pain   in your arms, neck, jaw, teeth, or back.  You feel sweaty, dizzy, or light-headed.  You have chest pain or shortness of breath.  You vomit and your vomit looks like blood or coffee grounds.  You faint.  Your stool is bloody or black.  You cannot swallow, drink, or eat.   This information is not intended to replace advice given to you by your health care provider. Make sure you discuss any questions you have with  your health care provider.   Document Released: 01/20/2005 Document Revised: 01/01/2015 Document Reviewed: 08/07/2014 Elsevier Interactive Patient Education 2016 Elsevier Inc.  Hematuria, Adult Hematuria is blood in your urine. It can be caused by a bladder infection, kidney infection, prostate infection, kidney stone, or cancer of your urinary tract. Infections can usually be treated with medicine, and a kidney stone usually will pass through your urine. If neither of these is the cause of your hematuria, further workup to find out the reason may be needed. It is very important that you tell your health care provider about any blood you see in your urine, even if the blood stops without treatment or happens without causing pain. Blood in your urine that happens and then stops and then happens again can be a symptom of a very serious condition. Also, pain is not a symptom in the initial stages of many urinary cancers. HOME CARE INSTRUCTIONS   Drink lots of fluid, 3-4 quarts a day. If you have been diagnosed with an infection, cranberry juice is especially recommended, in addition to large amounts of water.  Avoid caffeine, tea, and carbonated beverages because they tend to irritate the bladder.  Avoid alcohol because it may irritate the prostate.  Take all medicines as directed by your health care provider.  If you were prescribed an antibiotic medicine, finish it all even if you start to feel better.  If you have been diagnosed with a kidney stone, follow your health care provider's instructions regarding straining your urine to catch the stone.  Empty your bladder often. Avoid holding urine for long periods of time.  After a bowel movement, women should cleanse front to back. Use each tissue only once.  Empty your bladder before and after sexual intercourse if you are a male. SEEK MEDICAL CARE IF:  You develop back pain.  You have a fever.  You have a feeling of sickness in your  stomach (nausea) or vomiting.  Your symptoms are not better in 3 days. Return sooner if you are getting worse. SEEK IMMEDIATE MEDICAL CARE IF:   You develop severe vomiting and are unable to keep the medicine down.  You develop severe back or abdominal pain despite taking your medicines.  You begin passing a large amount of blood or clots in your urine.  You feel extremely weak or faint, or you pass out. MAKE SURE YOU:   Understand these instructions.  Will watch your condition.  Will get help right away if you are not doing well or get worse.   This information is not intended to replace advice given to you by your health care provider. Make sure you discuss any questions you have with your health care provider.   Document Released: 04/12/2005 Document Revised: 05/03/2014 Document Reviewed: 12/11/2012 Elsevier Interactive Patient Education [employer]2016 Elsevier Inc.

## 2015-05-11 NOTE — ED Provider Notes (Signed)
TIME SEEN: 4:15 AM  CHIEF COMPLAINT: Abdominal pain, nausea, diarrhea  HPI: Pt is a 35 y.o. male with history of tobacco use and previous history of Chlamydia who presents to the emergency department with complaints of diffuse allover crampy, bubbling, "knot-like" abdominal pain that is an 8/10 without radiation that started 3 days ago. He has had nausea and diarrhea but no vomiting. Also reports he does have a bitter, sour taste in his mouth. Denies dysuria, hematuria, penile discharge, testicular pain or swelling. Has never had similar symptoms. Denies abdominal surgeries. No sick contacts. Did recently travel back from [facility] [place].  ROS: See HPI Constitutional: no fever  Eyes: no drainage  ENT: no runny nose   Cardiovascular:  no chest pain  Resp: no SOB  GI: no vomiting GU: no dysuria Integumentary: no rash  Allergy: no hives  Musculoskeletal: no leg swelling  Neurological: no slurred speech ROS otherwise negative  PAST MEDICAL HISTORY/PAST SURGICAL HISTORY:  History reviewed. No pertinent past medical history.  MEDICATIONS:  Prior to Admission medications   Medication Sig Start Date End Date Taking? Authorizing Provider  amoxicillin-clavulanate (AUGMENTIN) 875-125 MG tablet Take 1 tablet by mouth every 12 (twelve) hours. 03/22/15   Renne CriglerJoshua Geiple, PA-C    ALLERGIES:  No Known Allergies  SOCIAL HISTORY:  Social History  Substance Use Topics  . Smoking status: Current Every Day Smoker -- 0.50 packs/day    Types: Cigarettes  . Smokeless tobacco: Not on file  . Alcohol Use: Yes     Comment: occasionally    FAMILY HISTORY: History reviewed. No pertinent family history.  EXAM: BP 133/79 mmHg  Pulse 62  Temp(Src) 97.5 F (36.4 C) (Oral)  Resp 18  Ht 6\' 4"  (1.93 m)  Wt 200 lb (90.719 kg)  BMI 24.35 kg/m2  SpO2 98% CONSTITUTIONAL: Alert and oriented and responds appropriately to questions. Well-appearing; well-nourished HEAD: Normocephalic EYES: Conjunctivae clear,  PERRL ENT: normal nose; no rhinorrhea; moist mucous membranes; pharynx without lesions noted NECK: Supple, no meningismus, no LAD  CARD: RRR; S1 and S2 appreciated; no murmurs, no clicks, no rubs, no gallops RESP: Normal chest excursion without splinting or tachypnea; breath sounds clear and equal bilaterally; no wheezes, no rhonchi, no rales, no hypoxia or respiratory distress, speaking full sentences ABD/GI: Normal bowel sounds; non-distended; soft, non-tender, no rebound, no guarding, no peritoneal signs, no tenderness at McBurney's point, negative Murphy sign BACK:  The back appears normal and is non-tender to palpation, there is no CVA tenderness EXT: Normal ROM in all joints; non-tender to palpation; no edema; normal capillary refill; no cyanosis, no calf tenderness or swelling    SKIN: Normal color for age and race; warm NEURO: Moves all extremities equally, sensation to light touch intact diffusely, cranial nerves II through XII intact PSYCH: The patient's mood and manner are appropriate. Grooming and personal hygiene are appropriate.  MEDICAL DECISION MAKING: Patient here with complaints of diffuse abdominal pain. I suspect that this is likely secondary to gastritis, GERD, viral illness. Will treat symptomatically with Protonix, Zofran, Imodium, fluids and GI cocktail. I do not feel he needs emergent abdominal imaging. Labs are unremarkable other than creatinine of 1.26 which may be his baseline. He is a large muscular man. He has received IV hydration in the emergency department. His urine does show large hemoglobin with 6-30 RBCs and a few bacteria. He is not having urinary symptoms and no other sign of infection. Given he recently returned from [place] I have offered him STD  testing and he agrees to have coronary chlamydia added onto his urine sample given this hematuria. He has not noticed any gross hematuria at home. He declines empiric treatment. Declines any further STD screens. He  states he is not concerned at this time that he has an STD. He does not have flank pain or history of kidney stones. His previous urine has had trace hematuria before. Have advised him he will need a follow-up with his PCP for this.  ED PROGRESS: Patient reports feeling much better after above medications. Able to drink without difficulty. We'll discharge with Protonix, Carafate, Zofran and Imodium. Have advised him to follow-up with the PCP if symptoms continue. Discussed diet recommendations. Discussed that his STD test will be pending and he will be contacted if abnormal. He verbalized understanding and is comfortable with this plan.    Layla Maw Marrah Vanevery, DO 05/11/15 [phone]

## 2015-05-11 NOTE — ED Notes (Signed)
Patient states that he has had stomach pain with Nausea and frequent loose stools. The patient reports that he has burping and it tastes like acid.

## 2015-05-12 LAB — GC/CHLAMYDIA PROBE AMP (~~LOC~~) NOT AT ARMC
Chlamydia: NEGATIVE
Neisseria Gonorrhea: NEGATIVE

## 2016-06-14 ENCOUNTER — Encounter (HOSPITAL_BASED_OUTPATIENT_CLINIC_OR_DEPARTMENT_OTHER): Payer: Self-pay | Admitting: Emergency Medicine

## 2016-06-14 ENCOUNTER — Emergency Department (HOSPITAL_BASED_OUTPATIENT_CLINIC_OR_DEPARTMENT_OTHER): Payer: BLUE CROSS/BLUE SHIELD

## 2016-06-14 ENCOUNTER — Emergency Department (HOSPITAL_BASED_OUTPATIENT_CLINIC_OR_DEPARTMENT_OTHER)
Admission: EM | Admit: 2016-06-14 | Discharge: 2016-06-14 | Disposition: A | Discharge status: Home / Self Care | Payer: BLUE CROSS/BLUE SHIELD | Attending: Emergency Medicine | Admitting: Emergency Medicine

## 2016-06-14 DIAGNOSIS — F17210 Nicotine dependence, cigarettes, uncomplicated: Secondary | ICD-10-CM | POA: Diagnosis not present

## 2016-06-14 DIAGNOSIS — R51 Headache: Secondary | ICD-10-CM | POA: Diagnosis present

## 2016-06-14 DIAGNOSIS — R519 Headache, unspecified: Secondary | ICD-10-CM

## 2016-06-14 DIAGNOSIS — J069 Acute upper respiratory infection, unspecified: Secondary | ICD-10-CM | POA: Diagnosis not present

## 2016-06-14 MED ORDER — FLUTICASONE PROPIONATE 50 MCG/ACT NA SUSP: 2.0000 | Freq: Every day | NASAL | 0 refills | Status: DC

## 2016-06-14 MED ORDER — GUAIFENESIN 100 MG/5ML PO LIQD: 100.0000 mg | ORAL | 0 refills | Status: DC | PRN

## 2016-06-14 NOTE — ED Provider Notes (Signed)
[facility] Provider Note   CSN: [id number] Arrival date & time: 06/14/16  0030   By signing my name below, I, Soijett Blue, attest that this documentation has been prepared under the direction and in the presence of Glynn Octave, MD. Electronically Signed: Soijett Blue, ED Scribe. 06/14/16. 1:00 AM.  History   Chief Complaint Chief Complaint  Patient presents with  . Headache    HPI Stephen Finley is a 36 y.o. male who presents to the Emergency Department complaining of intermittent, gradual onset, right sided HA x 3 weeks ago. He notes that his HA occurs mostly at night and woke him from his sleep tonight. Pt reports that he has a hx of headaches. Pt states that he has gas heat at home. He has tried goody powder and advil with no relief of his symptoms. He denies photophobia, weakness, numbness, vision change, and any other symptoms. Pt denies contacts with headaches at home.   Pt secondarily complains of gradually worsening flu-like symptoms onset 1 week ago. Pt reports associated nasal congestion, chills, and productive cough x green sputum. Pt states that he was evaluated at [facility] for similar symptoms and was dx with sinus infection and treated with amoxil that he completed 2 days ago. Pt hasn't tried any medications for the relief of his symptoms. Pt notes that he has sick contacts of his kids. Pt denies vomiting, diarrhea, rhinorrhea, abdominal pain, and any other symptoms.   The history is provided by the patient. No language interpreter was used.    History reviewed. No pertinent past medical history.  There are no active problems to display for this patient.   History reviewed. No pertinent surgical history.     Home Medications    Prior to Admission medications   Medication Sig Start Date End Date Taking? Authorizing Provider  amoxicillin-clavulanate (AUGMENTIN) 875-125 MG tablet Take 1 tablet by mouth every 12 (twelve) hours. 03/22/15    Renne Crigler, PA-C  loperamide (IMODIUM) 2 MG capsule Take 1 capsule (2 mg total) by mouth 4 (four) times daily as needed for diarrhea or loose stools. 05/11/15   Kristen N Ward, DO  ondansetron (ZOFRAN ODT) 4 MG disintegrating tablet Take 1 tablet (4 mg total) by mouth every 8 (eight) hours as needed for nausea or vomiting. 05/11/15   Kristen N Ward, DO  pantoprazole (PROTONIX) 40 MG tablet Take 1 tablet (40 mg total) by mouth daily. 05/11/15   Kristen N Ward, DO  sucralfate (CARAFATE) 1 g tablet Take 1 tablet (1 g total) by mouth 4 (four) times daily -  with meals and at bedtime. 05/11/15   Layla Maw Ward, DO    Family History History reviewed. No pertinent family history.  Social History Social History  Substance Use Topics  . Smoking status: Current Every Day Smoker    Packs/day: 0.50    Types: Cigarettes  . Smokeless tobacco: Never Used  . Alcohol use Yes     Comment: occasionally     Allergies   Patient has no known allergies.   Review of Systems Review of Systems A complete 10 system review of systems was obtained and all systems are negative except as noted in the HPI and PMH.   Physical Exam Updated Vital Signs BP 129/91 (BP Location: Right Arm)   Pulse 83   Temp 98.7 F (37.1 C) (Oral)   Resp 18   Ht 6\' 5"  (1.956 m)   Wt 205 lb (93 kg)   SpO2 98%  BMI 24.31 kg/m   Physical Exam  Constitutional: He is oriented to person, place, and time. He appears well-developed and well-nourished. No distress.  HENT:  Head: Normocephalic and atraumatic.  Right Ear: External ear normal.  Left Ear: External ear normal.  Mouth/Throat: Uvula is midline and mucous membranes are normal. Posterior oropharyngeal erythema present. No oropharyngeal exudate.  Frontal sinus tenderness  Eyes: Conjunctivae and EOM are normal. Pupils are equal, round, and reactive to light.  Neck: Normal range of motion. Neck supple.  No meningismus.  Cardiovascular: Normal rate, regular rhythm, normal  heart sounds and intact distal pulses.  Exam reveals no gallop and no friction rub.   No murmur heard. Pulmonary/Chest: Effort normal and breath sounds normal. No respiratory distress. He has no wheezes. He has no rales.  Abdominal: Soft. There is no tenderness. There is no rebound and no guarding.  Musculoskeletal: Normal range of motion. He exhibits no edema or tenderness.  Neurological: He is alert and oriented to person, place, and time. No cranial nerve deficit. He exhibits normal muscle tone. Coordination normal.   5/5 strength throughout. CN 2-12 intact. Equal grip strength.   Skin: Skin is warm.  Psychiatric: He has a normal mood and affect. His behavior is normal.  Nursing note and vitals reviewed.    ED Treatments / Results  DIAGNOSTIC STUDIES: Oxygen Saturation is 98% on RA, nl by my interpretation.    COORDINATION OF CARE: 1:08 AM Discussed treatment plan with pt at bedside which includes CT head, CXR, labs, and pt agreed to plan.   Labs (all labs ordered are listed, but only abnormal results are displayed) Labs Reviewed - No data to display  Radiology No results found.  Procedures Procedures (including critical care time)  Medications Ordered in ED Medications - No data to display   Initial Impression / Assessment and Plan / ED Course  I have reviewed the triage vital signs and the nursing notes.  Pertinent labs & imaging results that were available during my care of the patient were reviewed by me and considered in my medical decision making (see chart for details).     Patient with daily headaches for the past 3 weeks that wake him from sleep. No focal weakness, numbness or tingling. No fever. Has had "sinus issues" for the past 2 weeks getting worse. Recently completed antibiotics 2 days ago. Continues to have congestion, sore throat, cough and runny nose. nonfocal neuro exam. Given daily headaches that wake from sleep, will check CT head.  Also screen for  CO exposure.  CT head negative.  Carboxyhemoglobin lab not available. Bedside CO oximetry 7% which is acceptable in a smoker. Patient without headache currently.   Patient with URI symptoms and sinusitis.  Recently completed antibiotics.  Will give nasal steroids, decongestants. Supportive care at home. Followup with neurology regarding ongoing headaches.  Get CO monitor for home and work. Return precautions discussed.  Final Clinical Impressions(s) / ED Diagnoses   Final diagnoses:  Upper respiratory tract infection, unspecified type  Headache, unspecified headache type    New Prescriptions New Prescriptions   No medications on file   I personally performed the services described in this documentation, which was scribed in my presence. The recorded information has been reviewed and is accurate.     Glynn OctaveStephen Maika Kaczmarek, MD 06/14/16 [phone]

## 2016-06-14 NOTE — ED Notes (Signed)
Given something to drink 

## 2016-06-14 NOTE — ED Notes (Signed)
Pt given d/c instructions as per chart. Rx x 2. Verbalizes understanding. No questions. 

## 2016-06-14 NOTE — Discharge Instructions (Signed)
Follow up with the neurologist regarding your headache. Get a carbon monoxide detector for your home. Return to the ED if you develop new or worsening symptoms.

## 2016-06-14 NOTE — ED Notes (Signed)
Carbon monoxide 7-Dr. Rancour aware.

## 2016-06-14 NOTE — ED Notes (Signed)
Pt states he was treated for a sinus infection 3 weeks ago. Given Rx for Amoxicillin which he took all of. Now symptoms have returned. Taking Goody Powder and Advil which helps for about 1-2 hours.

## 2016-06-14 NOTE — ED Triage Notes (Signed)
Patient reports that he has had right sided headaches x 3 weeks. They come and go, mostly at night. They wake him up from sleep. He states that they last for 1 -2 hours and then they go away until the next night. [facility], but he continues to have them. He also reports runny nose and "flu like " symptoms

## 2016-06-14 NOTE — ED Notes (Signed)
Pt given results of CXR and CT.

## 2016-06-30 ENCOUNTER — Ambulatory Visit (INDEPENDENT_AMBULATORY_CARE_PROVIDER_SITE_OTHER): Payer: BLUE CROSS/BLUE SHIELD | Admitting: Neurology

## 2016-06-30 ENCOUNTER — Encounter: Payer: Self-pay | Admitting: Neurology

## 2016-06-30 VITALS — BP 119/80 | HR 73 | Ht 77.0 in | Wt 212.0 lb

## 2016-06-30 DIAGNOSIS — G44001 Cluster headache syndrome, unspecified, intractable: Secondary | ICD-10-CM

## 2016-06-30 DIAGNOSIS — R0981 Nasal congestion: Secondary | ICD-10-CM

## 2016-06-30 DIAGNOSIS — R7989 Other specified abnormal findings of blood chemistry: Secondary | ICD-10-CM

## 2016-06-30 DIAGNOSIS — G44009 Cluster headache syndrome, unspecified, not intractable: Secondary | ICD-10-CM | POA: Insufficient documentation

## 2016-06-30 MED ORDER — VERAPAMIL HCL ER 120 MG PO TBCR: 120.0000 mg | EXTENDED_RELEASE_TABLET | Freq: Every day | ORAL | 6 refills | Status: DC

## 2016-06-30 MED ORDER — METHYLPREDNISOLONE 4 MG PO TBPK: ORAL_TABLET | ORAL | 1 refills | Status: DC

## 2016-06-30 MED ORDER — SUMATRIPTAN SUCCINATE 11 MG/NOSEPC NA EXHP: 1.0000 | INHALANT_POWDER | Freq: Once | NASAL | 11 refills | Status: DC

## 2016-06-30 NOTE — Progress Notes (Signed)
[facility]    Provider:  Dr Lucia GaskinsAhern Referring Provider: ED Primary Care Physician:  [facility]  CC:  headaches  HPI:  Stephen Finley is a 36 y.o. male here as a referral from the emergency room for headaches. In the 20s he started getting headaches on the right side like being hit with a brief electric shot multiple times lasting up to a few hours. 2 months ago the headaches returned in the setting of sinus problems. They happen mostly at night about an hour after going to sleep and wake him up. Last a few hours. Severe. His eyes water and his nose runs. He paces, gets on his knees and paces. They happen most nights and sometimes during the day. Ibuprofen and goody powder help a little initially but not now just has to bear it out. He continues to have severe congestion, drainage, coughing. No other focal neurologic deficits, associated symptoms, inciting events or modifiable factors.  Reviewed notes, labs and imaging from outside physicians, which showed:  Patient was seen in the emergency room 06/14/2016. He was complaining of right-sided headache for 3 weeks. They occur mostly at night and wake him up from sleep. He has a history of headaches. He has tried goody powder and Advil with no relief. He denies photophobia, weakness, numbness, vision changes and any other symptoms. This is in the setting of flulike symptoms. He also reported nasal congestion, chills and productive cough. He was diagnosed with a sinus infection and treated with amoxicillin. He is a current every day smoker half pack per day. Exam showed posterior oropharyngeal erythema otherwise unremarkable with normal neurologic exam. No focal weakness numbness or tingling. No fever. Continued to have congestion, sore throat cough and runny nose. CT of the head was checked which was negative.   Urinalysis was abnormal with amber-colored urine, increased specific gravity, large hemoglobin. CBC  showed elevated creatinine 1.26 and BUN 14. CBC was unremarkable.   CT head 06/14/2016: showed No acute intracranial abnormalities including mass lesion or mass effect, hydrocephalus, extra-axial fluid collection, midline shift, hemorrhage, or acute infarction, large ischemic events (personally reviewed images)     Review of Systems: Patient complains of symptoms per HPI as well as the following symptoms: eye pain, feeling hot and cold, not enough sleep. Pertinent negatives per HPI. All others negative.   Social History   Social History  . Marital status: Single    Spouse name: N/A  . Number of children: N/A  . Years of education: N/A   Occupational History  . Not on file.   Social History Main Topics  . Smoking status: Current Every Day Smoker    Packs/day: 0.50    Types: Cigarettes  . Smokeless tobacco: Never Used  . Alcohol use 0.6 oz/week    1 Shots of liquor per week     Comment: occasionally  . Drug use: Yes    Types: Marijuana  . Sexual activity: Yes    Birth control/ protection: Condom   Other Topics Concern  . Not on file   Social History Narrative  . No narrative on file    Family History  Problem Relation Age of Onset  . Headache Neg Hx     Past Medical History:  Diagnosis Date  . Headache     Past Surgical History:  Procedure Laterality Date  . no surgical history      Current Outpatient Prescriptions  Medication Sig Dispense Refill  . methylPREDNISolone (MEDROL DOSEPAK) 4  MG TBPK tablet follow package directions 21 tablet 1  . SUMAtriptan Succinate (ONZETRA XSAIL) 11 MG/NOSEPC EXHP Place 1 spray into both nostrils once. Administer 2 nose pieces one per nostril. At onset of migraine can repeat after 2 hours no more than 2 doses in 24 hours 1 each 11  . verapamil (CALAN-SR) 120 MG CR tablet Take 1 tablet (120 mg total) by mouth at bedtime. 30 tablet 6   No current facility-administered medications for this visit.     Allergies as of  06/30/2016  . (No Known Allergies)    Vitals: BP 119/80 (BP Location: Right Arm, Patient Position: Sitting, Cuff Size: Normal)   Pulse 73   Ht 6\' 5"  (1.956 m)   Wt 212 lb (96.2 kg)   BMI 25.14 kg/m  Last Weight:  Wt Readings from Last 1 Encounters:  06/30/16 212 lb (96.2 kg)   Last Height:   Ht Readings from Last 1 Encounters:  06/30/16 6\' 5"  (1.956 m)   Physical exam: Exam: Gen: NAD, conversant, well nourised, well groomed                     CV: RRR, no MRG. No Carotid Bruits. No peripheral edema, warm, nontender Eyes: Conjunctivae clear without exudates or hemorrhage  Neuro: Detailed Neurologic Exam  Speech:    Speech is normal; fluent and spontaneous with normal comprehension.  Cognition:    The patient is oriented to person, place, and time;     recent and remote memory intact;     language fluent;     normal attention, concentration,     fund of knowledge Cranial Nerves:    The pupils are equal, round, and reactive to light. The fundi are normal and spontaneous venous pulsations are present. Visual fields are full to finger confrontation. Extraocular movements are intact. Trigeminal sensation is intact and the muscles of mastication are normal. The face is symmetric. The palate elevates in the midline. Hearing intact. Voice is normal. Shoulder shrug is normal. The tongue has normal motion without fasciculations.   Coordination:    Normal finger to nose and heel to shin. Normal rapid alternating movements.   Gait:    Heel-toe and tandem gait are normal.   Motor Observation:    No asymmetry, no atrophy, and no involuntary movements noted. Tone:    Normal muscle tone.    Posture:    Posture is normal. normal erect    Strength:    Strength is V/V in the upper and lower limbs.      Sensation: intact to LT     Reflex Exam:  DTR's:    Deep tendon reflexes in the upper and lower extremities are normal bilaterally.   Toes:    The toes are downgoing  bilaterally.   Clonus:    Clonus is absent.       Assessment/Plan:  36 year old patient with cluster headaches as well as chronic sinus infection.  - Will start verapamil discussed side effects stop for anything concerning -Oxygen, injectable or nasal triptan's are the best acute management. We'll try to get patient oxygen however had difficult times having insurance pay for it for this indication, we'll try Isaac Bliss which is Imitrex nasal powder. Discuss with Dr. Haroldine Laws before use due to nasal infection.  - Steroid Taper - Lab (bmp) to recheck creatinine (was elevated in ED) - ENT referral Dr. Riley Kill, MD  [facility] [address] [place], [place]  [address]  Phone [phone] Fax [phone]

## 2016-06-30 NOTE — Patient Instructions (Signed)
Remember to drink plenty of fluid, eat healthy meals and do not skip any meals. Try to eat protein with a every meal and eat a healthy snack such as fruit or nuts in between meals. Try to keep a regular sleep-wake schedule and try to exercise daily, particularly in the form of walking, 20-30 minutes a day, if you can.   As far as your medications are concerned, I would like to suggest  Onzetra at onset of headache may repeat once in 2 hours Verapamil before bed A steroid taper  As far as diagnostic testing: Lab today  I would like to see you back in 8 weeks, sooner if we need to. Please call us with any interim questions, concerns, problems, updates or refill requests.   Please also call us for any test results so we can go over those with you on the phone.  My clinical assistant and will answer any of your questions and relay your messages to me and also relay most of my messages to you.   Our phone number is [phone]. We also have an after hours call service for urgent matters and there is a physician on-call for urgent questions. For any emergencies you know to call 911 or go to the nearest emergency room Verapamil sustained-release capsules What is this medicine? VERAPAMIL (ver AP a mil) is a calcium-channel blocker. It affects the amount of calcium found in your heart and muscle cells. This relaxes your blood vessels, which can reduce the amount of work the heart has to do. This medicine is used to lower high blood pressure. This medicine may be used for other purposes such as Cluster Headaches; ask your health care provider or pharmacist if you have questions. COMMON BRAND NAME(S): Verelan, Verelan PM What should I tell my health care provider before I take this medicine? They need to know if you have any of these conditions: -heart or blood vessel disease -heart rhythm disturbances such as sick sinus syndrome, ventricular arrhythmias, Wolff-Parkinson-White syndrome, or  Lown-Ganong-Levine syndrome -liver or kidney disease -low blood pressure -an unusual or allergic reaction to verapamil, other medicines, foods, dyes, or preservatives -pregnant or trying to get pregnant -breast-feeding How should I use this medicine? Take this medicine by mouth with a glass of water. Follow the directions on the prescription label. The capsules may be opened and the medicine poured into a small amount of applesauce. Stir well and swallow without chewing. Take this medicine with food to reduce stomach upset. Take your doses at regular intervals. Do not take your medicine more often then directed. Do not stop taking except on the advice of your doctor or health care professional. Talk to your pediatrician regarding the use of this medicine in children. Special care may be needed. Overdosage: If you think you have taken too much of this medicine contact a poison control center or emergency room at once. NOTE: This medicine is only for you. Do not share this medicine with others. What if I miss a dose? If you miss a dose, take it as soon as you can. If it is almost time for your next dose, take only that dose. Do not take double or extra doses. What may interact with this medicine? Do not take this medicine with any of the following: -cisapride -disopyramide -dofetilide -grapefruit juice -hawthorn -pimozide -red yeast rice This medicine may also interact with the following medications: -barbiturates such as phenobarbital -cimetidine -cyclosporine -lithium -local anesthetics or general anesthetics -medicines for heart rhythm  problems like amiodarone, digoxin, flecainide, procainamide, quinidine -medicines for high blood pressure or heart problems -medicines for seizures like carbamazepine and phenytoin -rifampin, rifabutin or rifapentine -theophylline or aminophylline This list may not describe all possible interactions. Give your health care provider a list of all the  medicines, herbs, non-prescription drugs, or dietary supplements you use. Also tell them if you smoke, drink alcohol, or use illegal drugs. Some items may interact with your medicine. What should I watch for while using this medicine? Check your blood pressure and pulse rate regularly. Ask your doctor or health care professional what your blood pressure and pulse rate should be and when you should contact him or her. Do not suddenly stop taking this medicine. Ask your doctor or health care professional how to gradually reduce the dose. You may get drowsy or dizzy. Do not drive, use machinery, or do anything that needs mental alertness until you know how this medicine affects you. Do not stand or sit up quickly, especially if you are an older patient. This reduces the risk of dizzy or fainting spells. Alcohol may interfere with the effect of this medicine. Avoid alcoholic drinks. What side effects may I notice from receiving this medicine? Side effects that you should report to your doctor or health care professional as soon as possible: -difficulty breathing -dizziness or light headedness -fainting -fast heartbeat, palpitations, irregular heartbeat, or chest pain -skin rash -slow heartbeat -swelling of the legs or ankles Side effects that usually do not require medical attention (report to your doctor or health care professional if they continue or are bothersome): -constipation -facial flushing -headache -nausea, vomiting -sexual dysfunction -weakness or tiredness This list may not describe all possible side effects. Call your doctor for medical advice about side effects. You may report side effects to FDA at 1-800-FDA-1088. Where should I keep my medicine? Keep out of the reach of children. Store at room temperature between 15 and 25 degrees C (59 and 77 degrees F). Protect from light and moisture. Keep container tightly closed. NOTE: This sheet is a summary. It may not cover all possible  information. If you have questions about this medicine, talk to your doctor, pharmacist, or health care provider.  2018 Elsevier/Gold Standard (2008-01-08 17:47:58)  Methylprednisolone tablets What is this medicine? METHYLPREDNISOLONE (meth ill pred NISS oh lone) is a corticosteroid. It is commonly used to treat inflammation of the skin, joints, lungs, and other organs. Common conditions treated include asthma, allergies, and arthritis. It is also used for other conditions, such as blood disorders and diseases of the adrenal glands. This medicine may be used for other purposes; ask your health care provider or pharmacist if you have questions. COMMON BRAND NAME(S): Medrol, Medrol Dosepak What should I tell my health care provider before I take this medicine? They need to know if you have any of these conditions: -Cushing's syndrome -eye disease, vision problems -diabetes -glaucoma -heart disease -high blood pressure -infection (especially a virus infection such as chickenpox, cold sores, or herpes) -liver disease -mental illness -myasthenia gravis -osteoporosis -recently received or scheduled to receive a vaccine -seizures -stomach or intestine problems -thyroid disease -an unusual or allergic reaction to lactose, methylprednisolone, other medicines, foods, dyes, or preservatives -pregnant or trying to get pregnant -breast-feeding How should I use this medicine? Take this medicine by mouth with a glass of water. Follow the directions on the prescription label. Take this medicine with food. If you are taking this medicine once a day, take it in the morning.  Do not take it more often than directed. Do not suddenly stop taking your medicine because you may develop a severe reaction. Your doctor will tell you how much medicine to take. If your doctor wants you to stop the medicine, the dose may be slowly lowered over time to avoid any side effects. Talk to your pediatrician regarding the  use of this medicine in children. Special care may be needed. Overdosage: If you think you have taken too much of this medicine contact a poison control center or emergency room at once. NOTE: This medicine is only for you. Do not share this medicine with others. What if I miss a dose? If you miss a dose, take it as soon as you can. If it is almost time for your next dose, talk to your doctor or health care professional. You may need to miss a dose or take an extra dose. Do not take double or extra doses without advice. What may interact with this medicine? Do not take this medicine with any of the following medications: -alefacept -echinacea -live virus vaccines -metyrapone -mifepristone This medicine may also interact with the following medications: -amphotericin B -aspirin and aspirin-like medicines -certain antibiotics like erythromycin, clarithromycin, troleandomycin -certain medicines for diabetes -certain medicines for fungal infections like ketoconazole -certain medicines for seizures like carbamazepine, phenobarbital, phenytoin -certain medicines that treat or prevent blood clots like warfarin -cholestyramine -cyclosporine -digoxin -diuretics -male hormones, like estrogens and birth control pills -isoniazid -NSAIDs, medicines for pain inflammation, like ibuprofen or naproxen -other medicines for myasthenia gravis -rifampin -vaccines This list may not describe all possible interactions. Give your health care provider a list of all the medicines, herbs, non-prescription drugs, or dietary supplements you use. Also tell them if you smoke, drink alcohol, or use illegal drugs. Some items may interact with your medicine. What should I watch for while using this medicine? Tell your doctor or healthcare professional if your symptoms do not start to get better or if they get worse. Do not stop taking except on your doctor's advice. You may develop a severe reaction. Your doctor will  tell you how much medicine to take. This medicine may increase your risk of getting an infection. Tell your doctor or health care professional if you are around anyone with measles or chickenpox, or if you develop sores or blisters that do not heal properly. This medicine may affect blood sugar levels. If you have diabetes, check with your doctor or health care professional before you change your diet or the dose of your diabetic medicine. Tell your doctor or health care professional right away if you have any change in your eyesight. Using this medicine for a long time may increase your risk of low bone mass. Talk to your doctor about bone health. What side effects may I notice from receiving this medicine? Side effects that you should report to your doctor or health care professional as soon as possible: -allergic reactions like skin rash, itching or hives, swelling of the face, lips, or tongue -bloody or tarry stools -changes in vision -hallucination, loss of contact with reality -muscle cramps -muscle pain -palpitations -signs and symptoms of high blood sugar such as dizziness; dry mouth; dry skin; fruity breath; nausea; stomach pain; increased hunger or thirst; increased urination -signs and symptoms of infection like fever or chills; cough; sore throat; pain or trouble passing urine -trouble passing urine or change in the amount of urine Side effects that usually do not require medical attention (report to your  doctor or health care professional if they continue or are bothersome): -changes in emotions or mood -constipation -diarrhea -excessive hair growth on the face or body -headache -nausea, vomiting -trouble sleeping -weight gain This list may not describe all possible side effects. Call your doctor for medical advice about side effects. You may report side effects to FDA at 1-800-FDA-1088. Where should I keep my medicine? Keep out of the reach of children. Store at room  temperature between 20 and 25 degrees C (68 and 77 degrees F). Throw away any unused medicine after the expiration date. NOTE: This sheet is a summary. It may not cover all possible information. If you have questions about this medicine, talk to your doctor, pharmacist, or health care provider.  2018 Elsevier/Gold Standard (2015-06-19 15:53:30)    Sumatriptan nasal powder What is this medicine? SUMATRIPTAN (soo ma TRIP tan) is used to treat migraines with or without aura. An aura is a strange feeling or visual disturbance that warns you of an attack. It is not used to prevent migraines. This medicine may be used for other purposes; ask your health care provider or pharmacist if you have questions. COMMON BRAND NAME(S): ONZETRA What should I tell my health care provider before I take this medicine? They need to know if you have any of these conditions: -circulation problems in fingers and toes -diabetes -heart disease -high blood pressure -high cholesterol -history of irregular heartbeat -history of stroke -kidney disease -liver disease -postmenopausal or surgical removal of uterus and ovaries -seizures -smoke tobacco -stomach or intestine problems -an unusual or allergic reaction to sumatriptan, other medicines, foods, dyes, or preservatives -pregnant or trying to get pregnant -breast-feeding How should I use this medicine? This medicine is for use in the nose. Follow the directions on the prescription label. This medicine is taken at the first symptoms of a migraine. It is not for everyday use. Do not take your medicine more often than directed. Talk to your pediatrician regarding the use of this medicine in children. Special care may be needed. Overdosage: If you think you have taken too much of this medicine contact a poison control center or emergency room at once. NOTE: This medicine is only for you. Do not share this medicine with others. What if I miss a dose? This does not  apply; this medicine is not for regular use. What may interact with this medicine? Do not take this medicine with any of the following medicines: -cocaine -ergot alkaloids like dihydroergotamine, ergonovine, ergotamine, methylergonovine -feverfew -MAOIs like Carbex, Eldepryl, Marplan, Nardil, and Parnate -other medicines for migraine headache like almotriptan, eletriptan, frovatriptan, naratriptan, rizatriptan, zolmitriptan -tryptophan This medicine may also interact with the following medications: -certain medicines for depression, anxiety, or psychotic disturbances This list may not describe all possible interactions. Give your health care provider a list of all the medicines, herbs, non-prescription drugs, or dietary supplements you use. Also tell them if you smoke, drink alcohol, or use illegal drugs. Some items may interact with your medicine. What should I watch for while using this medicine? Only take this medicine for a migraine headache. Take it if you get warning symptoms or at the start of a migraine attack. It is not for regular use to prevent migraine attacks. You may get drowsy or dizzy. Do not drive, use machinery, or do anything that needs mental alertness until you know how this medicine affects you. Do not stand or sit up quickly, especially if you are an older patient. This reduces the risk  of dizzy or fainting spells. Alcohol may interfere with the effect of this medicine. Avoid alcoholic drinks. Smoking cigarettes may increase the risk of heart-related side effects from using this medicine. If you take migraine medicines for 10 or more days a month, your migraines may get worse. Keep a diary of headache days and medicine use. Contact your healthcare professional if your migraine attacks occur more frequently. What side effects may I notice from receiving this medicine? Side effects that you should report to your doctor or health care professional as soon as possible: -allergic  reactions like skin rash, itching or hives, swelling of the face, lips, or tongue -bloody or watery diarrhea -hallucination, loss of contact with reality -pain, tingling, numbness in the face, hands, or feet -seizures -signs and symptoms of a blood clot such as breathing problems; changes in vision; chest pain; severe, sudden headache; pain, swelling, warmth in the leg; trouble speaking; sudden numbness or weakness of the face, arm, or leg -signs and symptoms of a dangerous change in heartbeat or heart rhythm like chest pain; dizziness; fast or irregular heartbeat; palpitations, feeling faint or lightheaded; falls; breathing problems -signs and symptoms of a stroke like changes in vision; confusion; trouble speaking or understanding; severe headaches; sudden numbness or weakness of the face, arm, or leg; trouble walking; dizziness; loss of balance or coordination -stomach pain Side effects that usually do not require medical attention (report to your doctor or health care professional if they continue or are bothersome): -changes in taste -facial flushing -headache -muscle cramps -muscle pain -nausea, vomiting -weak or tired This list may not describe all possible side effects. Call your doctor for medical advice about side effects. You may report side effects to FDA at 1-800-FDA-1088. Where should I keep my medicine? Keep out of the reach of children. Store at room temperature between 15 and 30 degrees C (59 and 86 degrees F). Throw away any unused medicine after the expiration date. NOTE: This sheet is a summary. It may not cover all possible information. If you have questions about this medicine, talk to your doctor, pharmacist, or health care provider.  2018 Elsevier/Gold Standard (2015-05-15 08:54:41)

## 2016-07-01 ENCOUNTER — Telehealth: Payer: Self-pay | Admitting: *Deleted

## 2016-07-01 LAB — BASIC METABOLIC PANEL
BUN/Creatinine Ratio: 9 (ref 9–20)
BUN: 11 mg/dL (ref 6–20)
CHLORIDE: 102 mmol/L (ref 96–106)
CO2: 27 mmol/L (ref 18–29)
CREATININE: 1.21 mg/dL (ref 0.76–1.27)
Calcium: 9.6 mg/dL (ref 8.7–10.2)
GFR calc Af Amer: 88 mL/min/{1.73_m2} (ref >59)
GFR calc non Af Amer: 76 mL/min/{1.73_m2} (ref >59)
GLUCOSE: 93 mg/dL (ref 65–99)
POTASSIUM: 4.6 mmol/L (ref 3.5–5.2)
SODIUM: 144 mmol/L (ref 134–144)

## 2016-07-01 NOTE — Telephone Encounter (Signed)
Per Dr Lucia GaskinsAhern, spoke with patient and informed him his labs are normal. Patient verbalized understanding. He then stated he has appointment with ENT but doesn't have the address. Per Epic, patient will be seeing Dr Haroldine Lawsrossley; gave hm address and advised it is [facility].  He verbalized appreciation.

## 2016-07-15 ENCOUNTER — Telehealth: Payer: Self-pay

## 2016-07-15 NOTE — Telephone Encounter (Signed)
Received faxed ENT consult notes from 07/08/16 OV for allergic rhinitis, turbinate hypertrophy, septal deviation, maxillary and ethmoid sinusitis and laryngitis. Dr. Haroldine Lawsrossley placed pt on antibx (Doxy 100 mg BID # 20), Allegra-D12 daily in the evening, Lactinex BID as probiotic and Flonase nasal spray. Pt is to return in 1 wk for follow-up. Notes given to Dr. Lucia GaskinsAhern for review.

## 2016-10-01 NOTE — Telephone Encounter (Signed)
Patient saw Dr. Haroldine Lawsrossley again on 09/08/16. "He will be calling back for scheduling for this septal reconstruction, turbinate reduction under general endotracheal anesthesia. We talked about the entire procedure and the amount of time he would be out of work, and he is just going to call back." Notes sent to med records for scanning, copy to Dr. Lucia GaskinsAhern for review.

## 2016-10-05 ENCOUNTER — Other Ambulatory Visit: Payer: Self-pay | Admitting: Otolaryngology

## 2016-10-12 NOTE — Telephone Encounter (Signed)
Received operative report. Pt had septoplasty and submucous resection w/ cartilage contouring, turbinate reduction on 10/05/16. The patient tolerated the procedure well and doing well post-operatively per Dr. Haroldine Lawsrossley. Sent to med records for scanning, copy to Dr. Lucia GaskinsAhern for review.

## 2016-11-15 ENCOUNTER — Encounter (HOSPITAL_BASED_OUTPATIENT_CLINIC_OR_DEPARTMENT_OTHER): Payer: Self-pay

## 2016-11-15 ENCOUNTER — Emergency Department (HOSPITAL_BASED_OUTPATIENT_CLINIC_OR_DEPARTMENT_OTHER)
Admission: EM | Admit: 2016-11-15 | Discharge: 2016-11-15 | Disposition: A | Discharge status: Home / Self Care | Payer: BLUE CROSS/BLUE SHIELD | Attending: Emergency Medicine | Admitting: Emergency Medicine

## 2016-11-15 DIAGNOSIS — R369 Urethral discharge, unspecified: Secondary | ICD-10-CM

## 2016-11-15 DIAGNOSIS — F17210 Nicotine dependence, cigarettes, uncomplicated: Secondary | ICD-10-CM | POA: Diagnosis not present

## 2016-11-15 DIAGNOSIS — R360 Urethral discharge without blood: Secondary | ICD-10-CM | POA: Diagnosis not present

## 2016-11-15 MED ORDER — ONDANSETRON 4 MG PO TBDP
4.0000 mg | ORAL_TABLET | Freq: Once | ORAL | Status: AC
  Administered 2016-11-15: 4 mg via ORAL
  Filled 2016-11-15: qty 1

## 2016-11-15 MED ORDER — METRONIDAZOLE 500 MG PO TABS
2000.0000 mg | ORAL_TABLET | Freq: Once | ORAL | Status: AC
  Administered 2016-11-15: 2000 mg via ORAL
  Filled 2016-11-15: qty 4

## 2016-11-15 MED ORDER — AZITHROMYCIN 250 MG PO TABS
1000.0000 mg | ORAL_TABLET | Freq: Once | ORAL | Status: AC
  Administered 2016-11-15: 1000 mg via ORAL
  Filled 2016-11-15: qty 4

## 2016-11-15 MED ORDER — CEFTRIAXONE SODIUM 250 MG IJ SOLR
250.0000 mg | Freq: Once | INTRAMUSCULAR | Status: AC
  Administered 2016-11-15: 250 mg via INTRAMUSCULAR
  Filled 2016-11-15: qty 250

## 2016-11-15 NOTE — ED Triage Notes (Signed)
C/o penile d/c x 3 days-NAD-steady gait

## 2016-11-15 NOTE — ED Provider Notes (Signed)
[facility]   CSN: [id number] Arrival date & time: 11/15/16  1533     History   Chief Complaint Chief Complaint  Patient presents with  . Penile Discharge    HPI Stephen Finley is a 36 y.o. male.  HPI   Pt presents with 3 days of thick white penile discharge.  Denies fevers, abdominal pain, N/V, testicular pain or swelling, genital sores.    Past Medical History:  Diagnosis Date  . Headache     Patient Active Problem List   Diagnosis Date Noted  . Cluster headache 06/30/2016    Past Surgical History:  Procedure Laterality Date  . no surgical history         Home Medications    Prior to Admission medications   Not on File    Family History Family History  Problem Relation Age of Onset  . Headache Neg Hx     Social History Social History  Substance Use Topics  . Smoking status: Current Every Day Smoker    Packs/day: 0.50    Types: Cigarettes  . Smokeless tobacco: Never Used  . Alcohol use Yes     Comment: weekly     Allergies   Patient has no known allergies.   Review of Systems Review of Systems  Constitutional: Negative for chills and fever.  Gastrointestinal: Negative for abdominal pain, nausea and vomiting.  Genitourinary: Positive for discharge. Negative for frequency, hematuria, scrotal swelling, testicular pain and urgency.  Allergic/Immunologic: Negative for immunocompromised state.     Physical Exam Updated Vital Signs BP (!) 136/92 (BP Location: Left Arm)   Pulse 88   Temp 98 F (36.7 C) (Oral)   Resp 18   Ht 6\' 4"  (1.93 m)   Wt 90.7 kg (200 lb)   SpO2 98%   BMI 24.34 kg/m   Physical Exam  Constitutional: He appears well-developed and well-nourished. No distress.  HENT:  Head: Normocephalic and atraumatic.  Neck: Neck supple.  Pulmonary/Chest: Effort normal.  Abdominal: Soft. There is no tenderness. There is no rebound and no guarding.  Genitourinary: Testes normal. Right testis shows  no mass, no swelling and no tenderness. Right testis is descended. Left testis shows no mass, no swelling and no tenderness. Left testis is descended. Circumcised. No penile erythema or penile tenderness. Discharge found.  Neurological: He is alert.  Skin: He is not diaphoretic.  Nursing note and vitals reviewed.    ED Treatments / Results  Labs (all labs ordered are listed, but only abnormal results are displayed) Labs Reviewed  RPR  HIV ANTIBODY (ROUTINE TESTING)  GC/CHLAMYDIA PROBE AMP (Laura) NOT AT [facility]    EKG  EKG Interpretation None       Radiology No results found.  Procedures Procedures (including critical care time)  Medications Ordered in ED Medications  cefTRIAXone (ROCEPHIN) injection 250 mg (250 mg Intramuscular Given 11/15/16 1602)  azithromycin (ZITHROMAX) tablet 1,000 mg (1,000 mg Oral Given 11/15/16 1600)  metroNIDAZOLE (FLAGYL) tablet 2,000 mg (2,000 mg Oral Given 11/15/16 1600)  ondansetron (ZOFRAN-ODT) disintegrating tablet 4 mg (4 mg Oral Given 11/15/16 1601)     Initial Impression / Assessment and Plan / ED Course  I have reviewed the triage vital signs and the nursing notes.  Pertinent labs & imaging results that were available during my care of the patient were reviewed by me and considered in my medical decision making (see chart for details).     Afebrile, nontoxic patient with abnormal penile  discharge, no other symptoms.  Treated empirically for GC/Chlam, trichomonas.  Advised sexual partners need testing and treatment and My Chart information.   D/C home.  Discussed result, findings, treatment, and follow up  with patient.  Pt given return precautions.  Pt verbalizes understanding and agrees with plan.       Final Clinical Impressions(s) / ED Diagnoses   Final diagnoses:  Penile discharge    New Prescriptions There are no discharge medications for this patient.    Trixie DredgeWest, Mayar Whittier, PA-C 11/15/16 1645    Rolan BuccoBelfi, Melanie,  MD 11/15/16 [phone]

## 2016-11-15 NOTE — Discharge Instructions (Signed)
Read the information below.  You may return to the Emergency Department at any time for worsening condition or any new symptoms that concern you.  If you develop high fevers, testicular or abdominal pain, uncontrolled vomiting, or are unable to tolerate fluids by mouth, return to the ER for a recheck.    You sexual partner(s) must also be tested and treated.  Your results will be available on My Chart in a few days.  You have been empirically treated for gonorrhea, chlamydia, and trichomonas.  Do not engage in sexual activity for 10 days and until all partners are treated.

## 2016-11-16 LAB — HIV ANTIBODY (ROUTINE TESTING W REFLEX): HIV Screen 4th Generation wRfx: NONREACTIVE

## 2016-11-16 LAB — GC/CHLAMYDIA PROBE AMP (~~LOC~~) NOT AT ARMC
Chlamydia: NEGATIVE
Neisseria Gonorrhea: POSITIVE — AB

## 2016-11-16 LAB — RPR: RPR Ser Ql: NONREACTIVE

## 2018-04-15 IMAGING — CT CT HEAD W/O CM
3 series · 15 of 47 positions shown, 18 images · non-contrast
Comparison: None.

CLINICAL DATA: Right-sided headache

EXAM:
CT HEAD WITHOUT CONTRAST
TECHNIQUE: Contiguous axial images were obtained from the base of the skull
through the vertex without intravenous contrast.

[Series 2: head wo · axial · 0.50mm/px · z∈[+128,+262]mm · 9 of 33 slices shown, 12 images]
[im 3/33  brain]
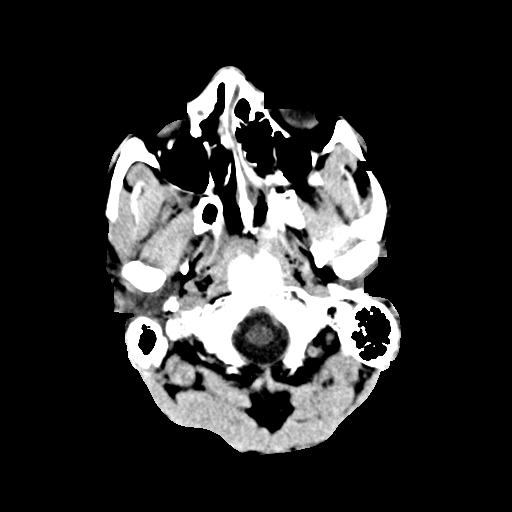
[im 3/33  bone]
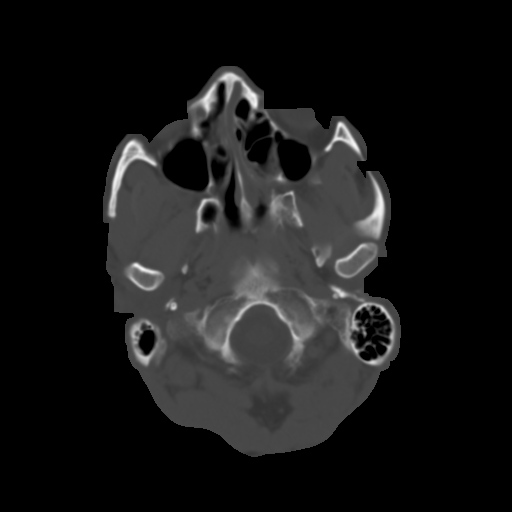
[im 6/33  brain]
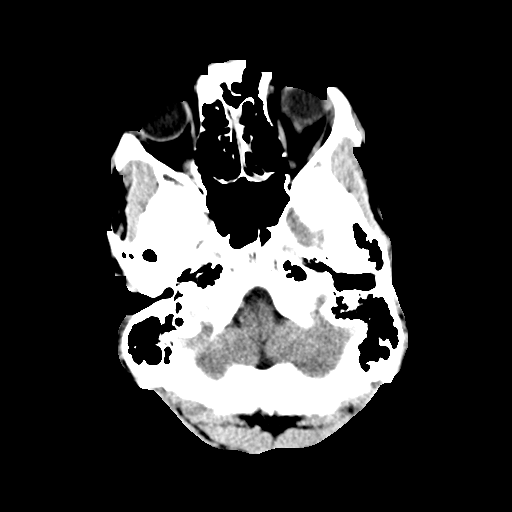
[im 9/33  brain]
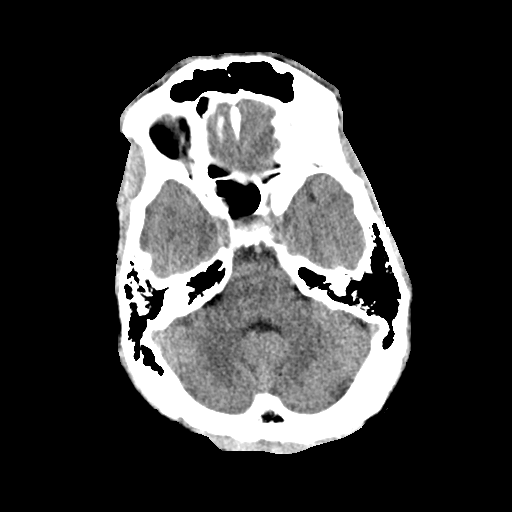
[im 13/33  brain]
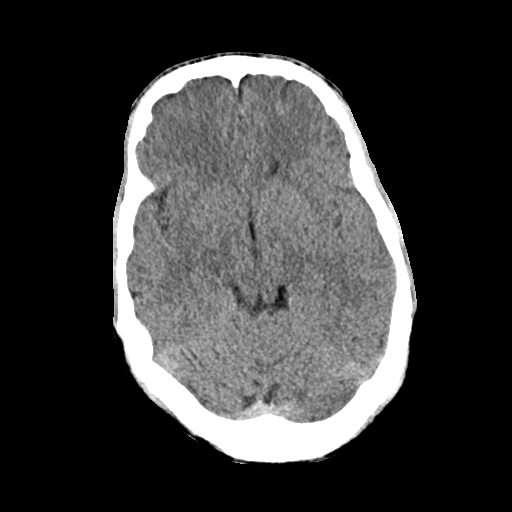
[im 17/33  brain]
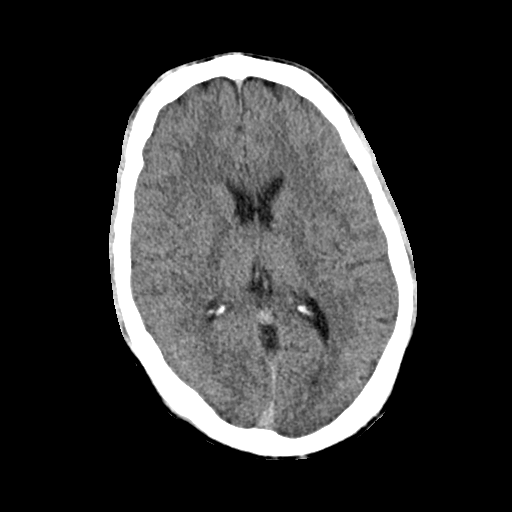
[im 17/33  bone]
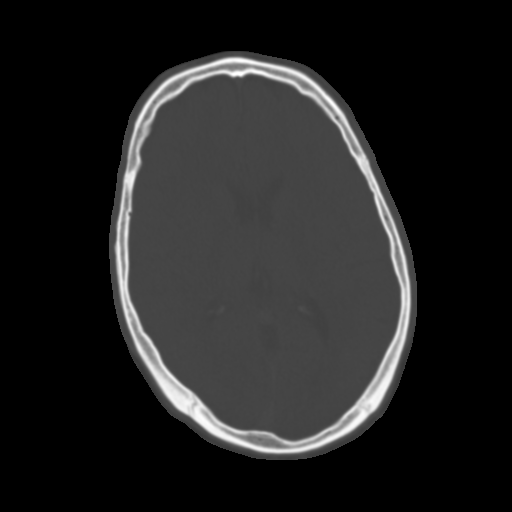
[im 20/33  brain]
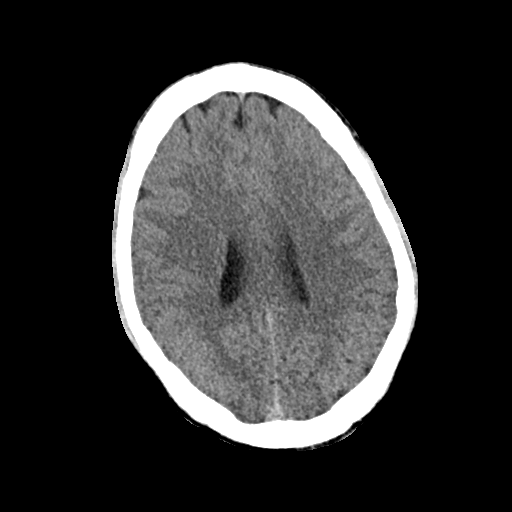
[im 24/33  brain]
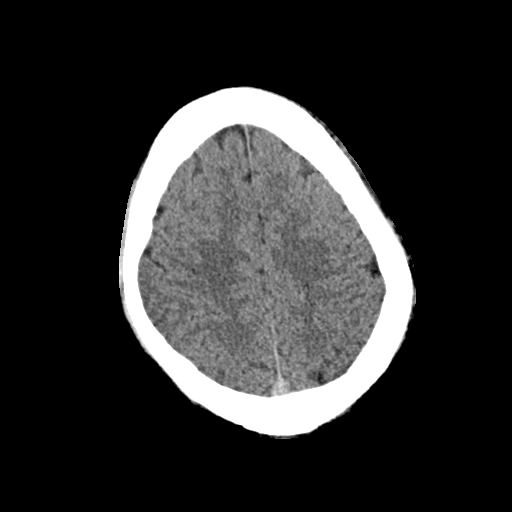
[im 27/33  brain]
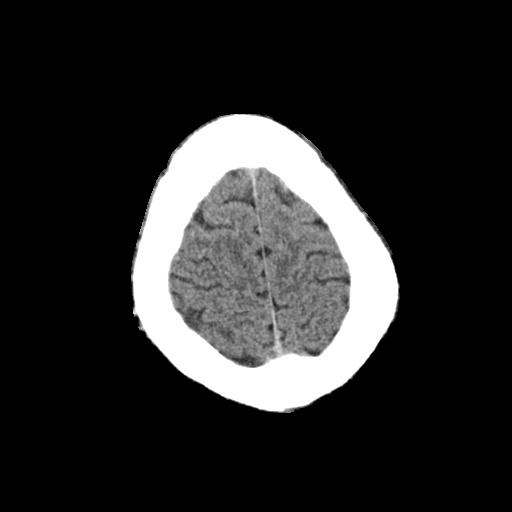
[im 30/33  brain]
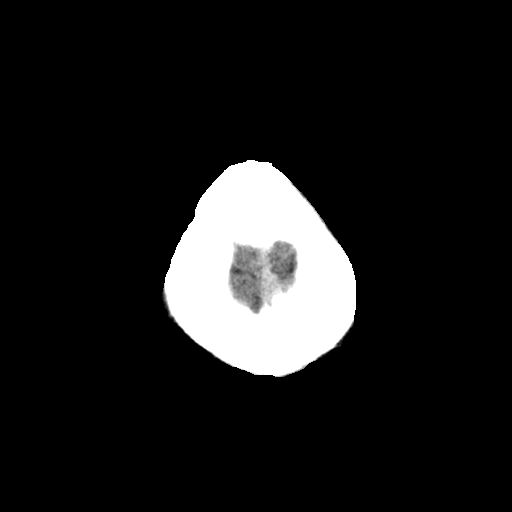
[im 30/33  bone]
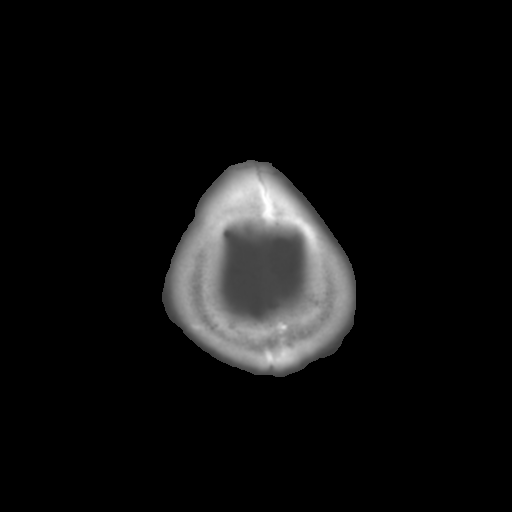

[Series 4: coronal soft · coronal · 0.33mm/px · 3 of 77 slices shown]
[im 26/77  brain]
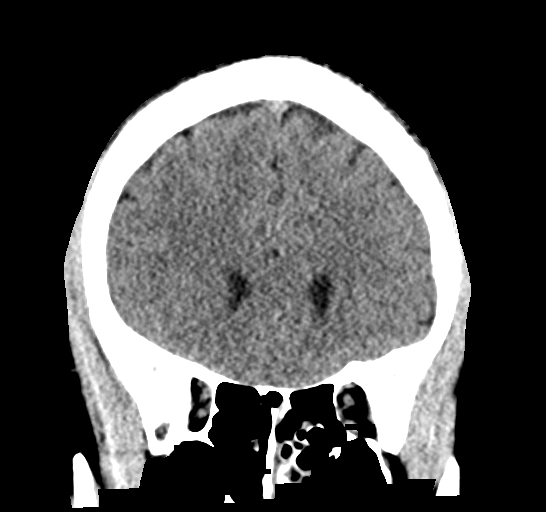
[im 34/77  brain]
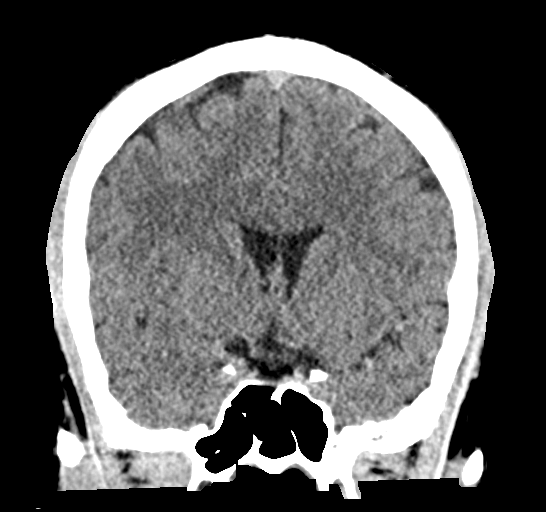
[im 43/77  brain]
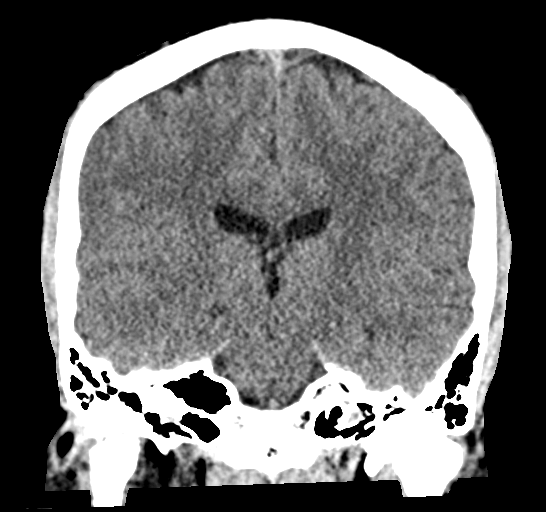

[Series 5: sag soft · sagittal · 0.36mm/px · 3 of 67 slices shown]
[im 23/67  brain]
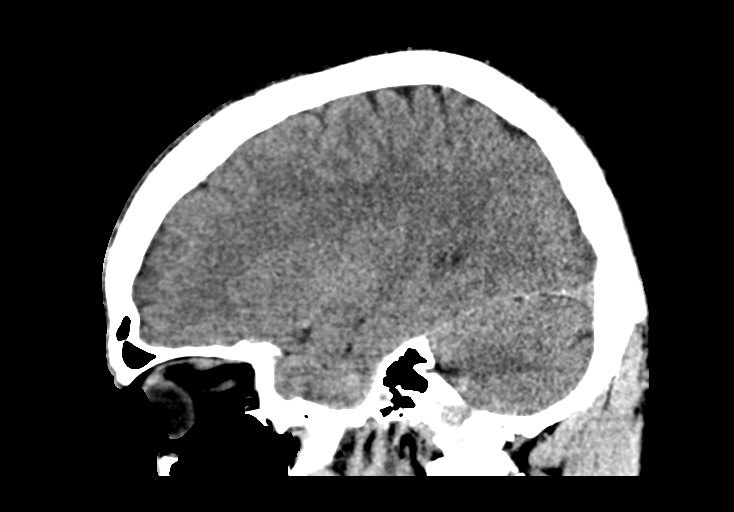
[im 34/67  brain]
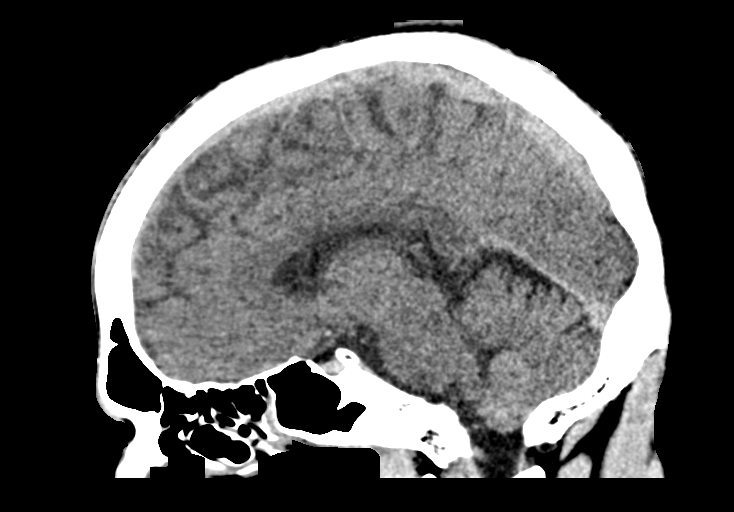
[im 45/67  brain]
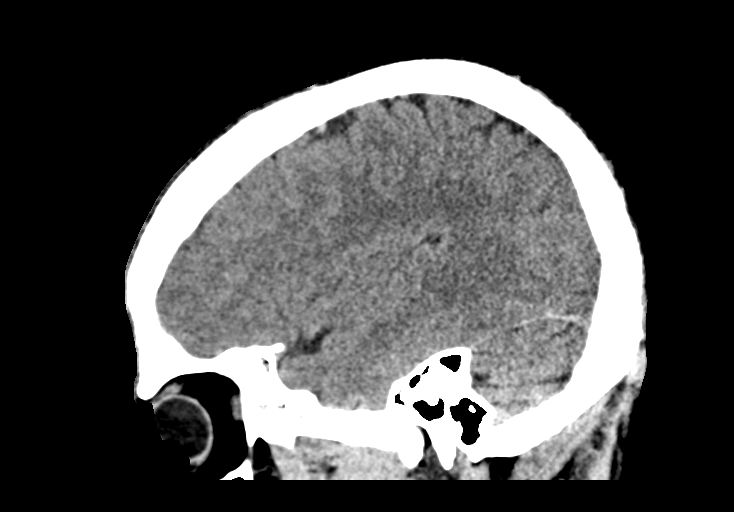

[15 of 47 positions shown; findings below may reference images not displayed]

FINDINGS: Brain: No evidence of acute infarction, hemorrhage, hydrocephalus,
extra-axial collection or mass lesion/mass effect.

Vascular: No hyperdense vessel or unexpected calcification.

Skull: Normal. Negative for fracture or focal lesion.

Sinuses/Orbits: Deviated nasal septum to the right. Mucosal
thickening in the maxillary and ethmoid sinuses. No acute orbital
abnormality.

Other: None
IMPRESSION: No definite CT evidence for acute intracranial abnormality. MRI
follow-up may be performed as clinically indicated.

## 2018-04-15 IMAGING — CR DG CHEST 2V
2 series · 2 of 2 positions shown · non-contrast
Comparison: None.

CLINICAL DATA: 35-year-old male with intermittent headaches, cough
and congestion.

EXAM:
CHEST  2 VIEW

[w chest pa]
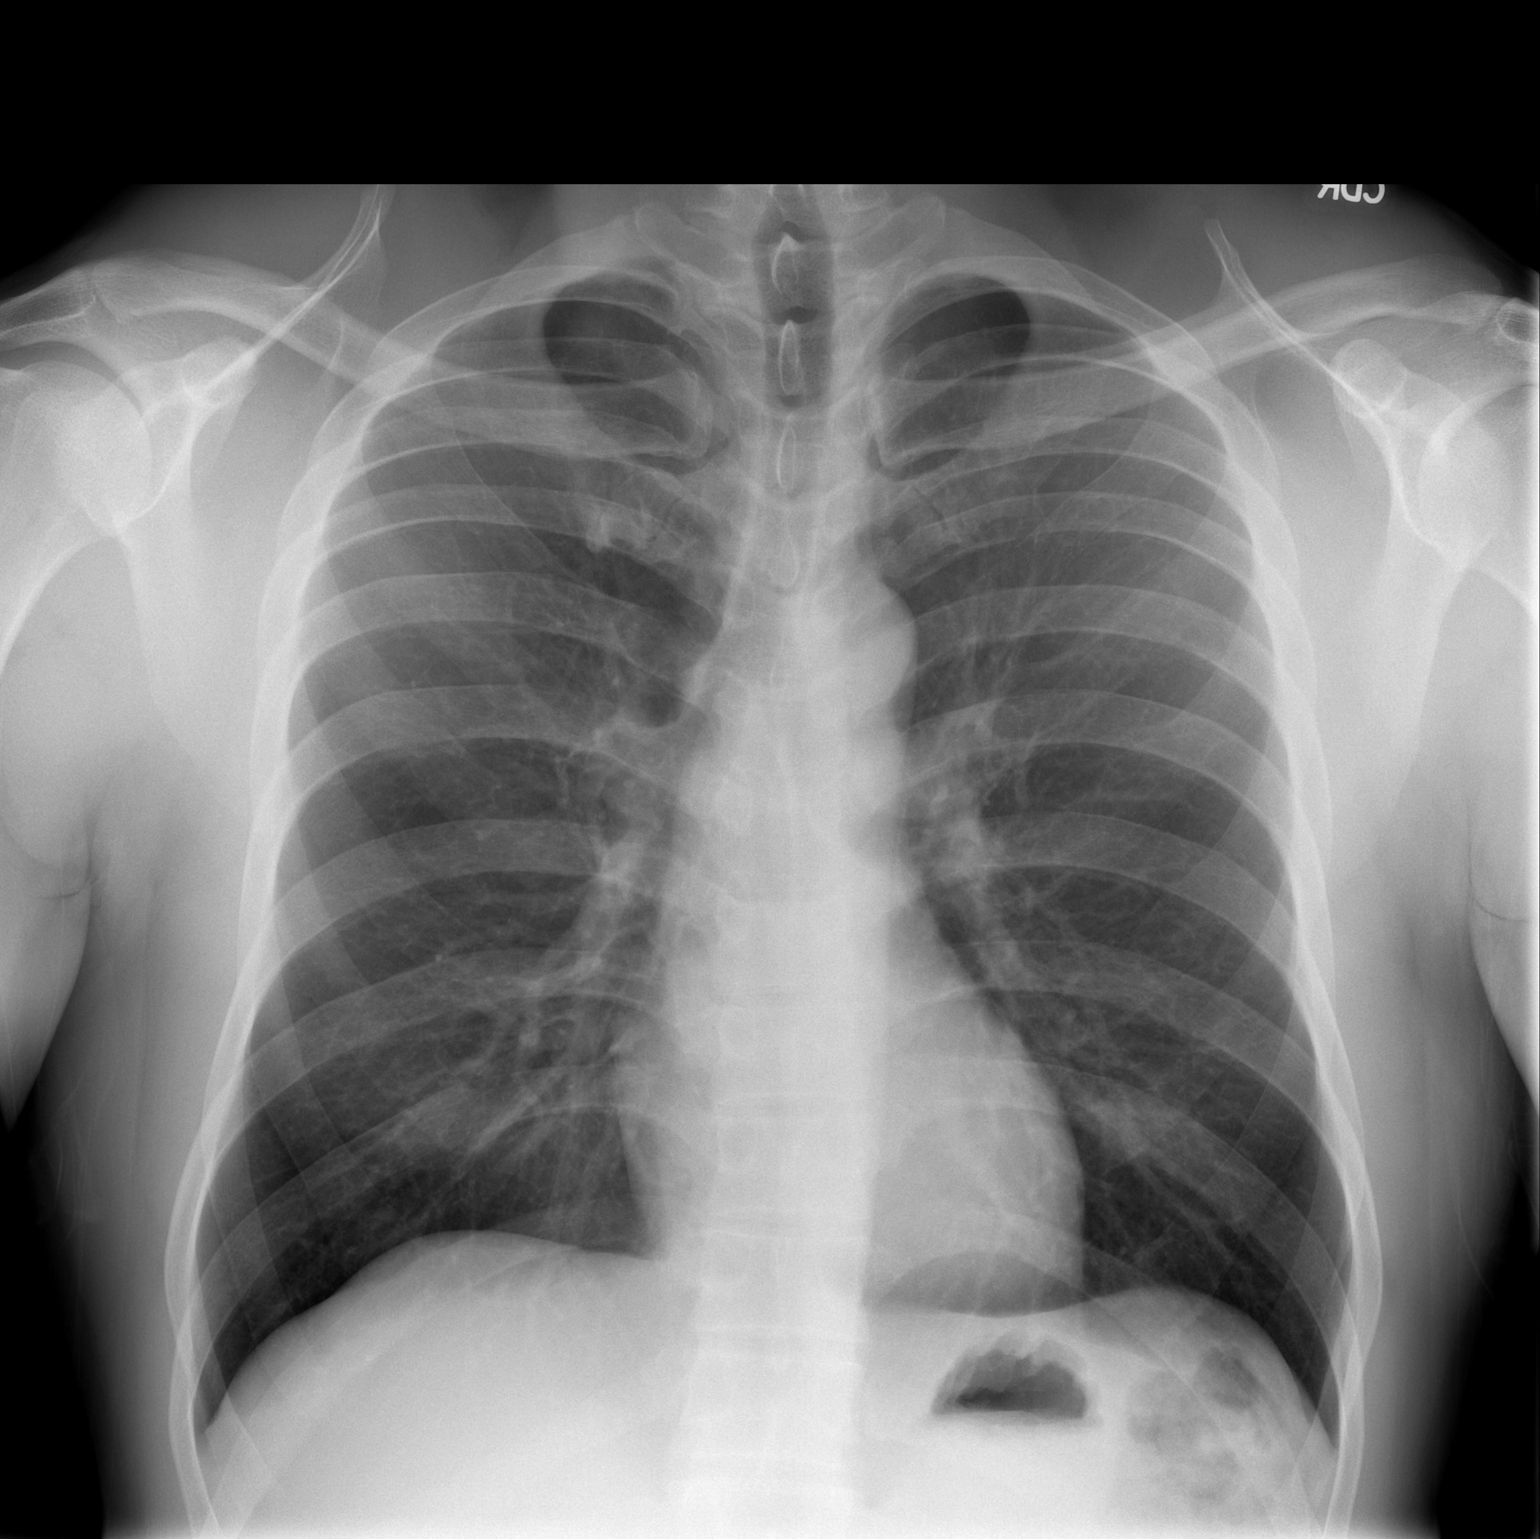

[w chest lat]
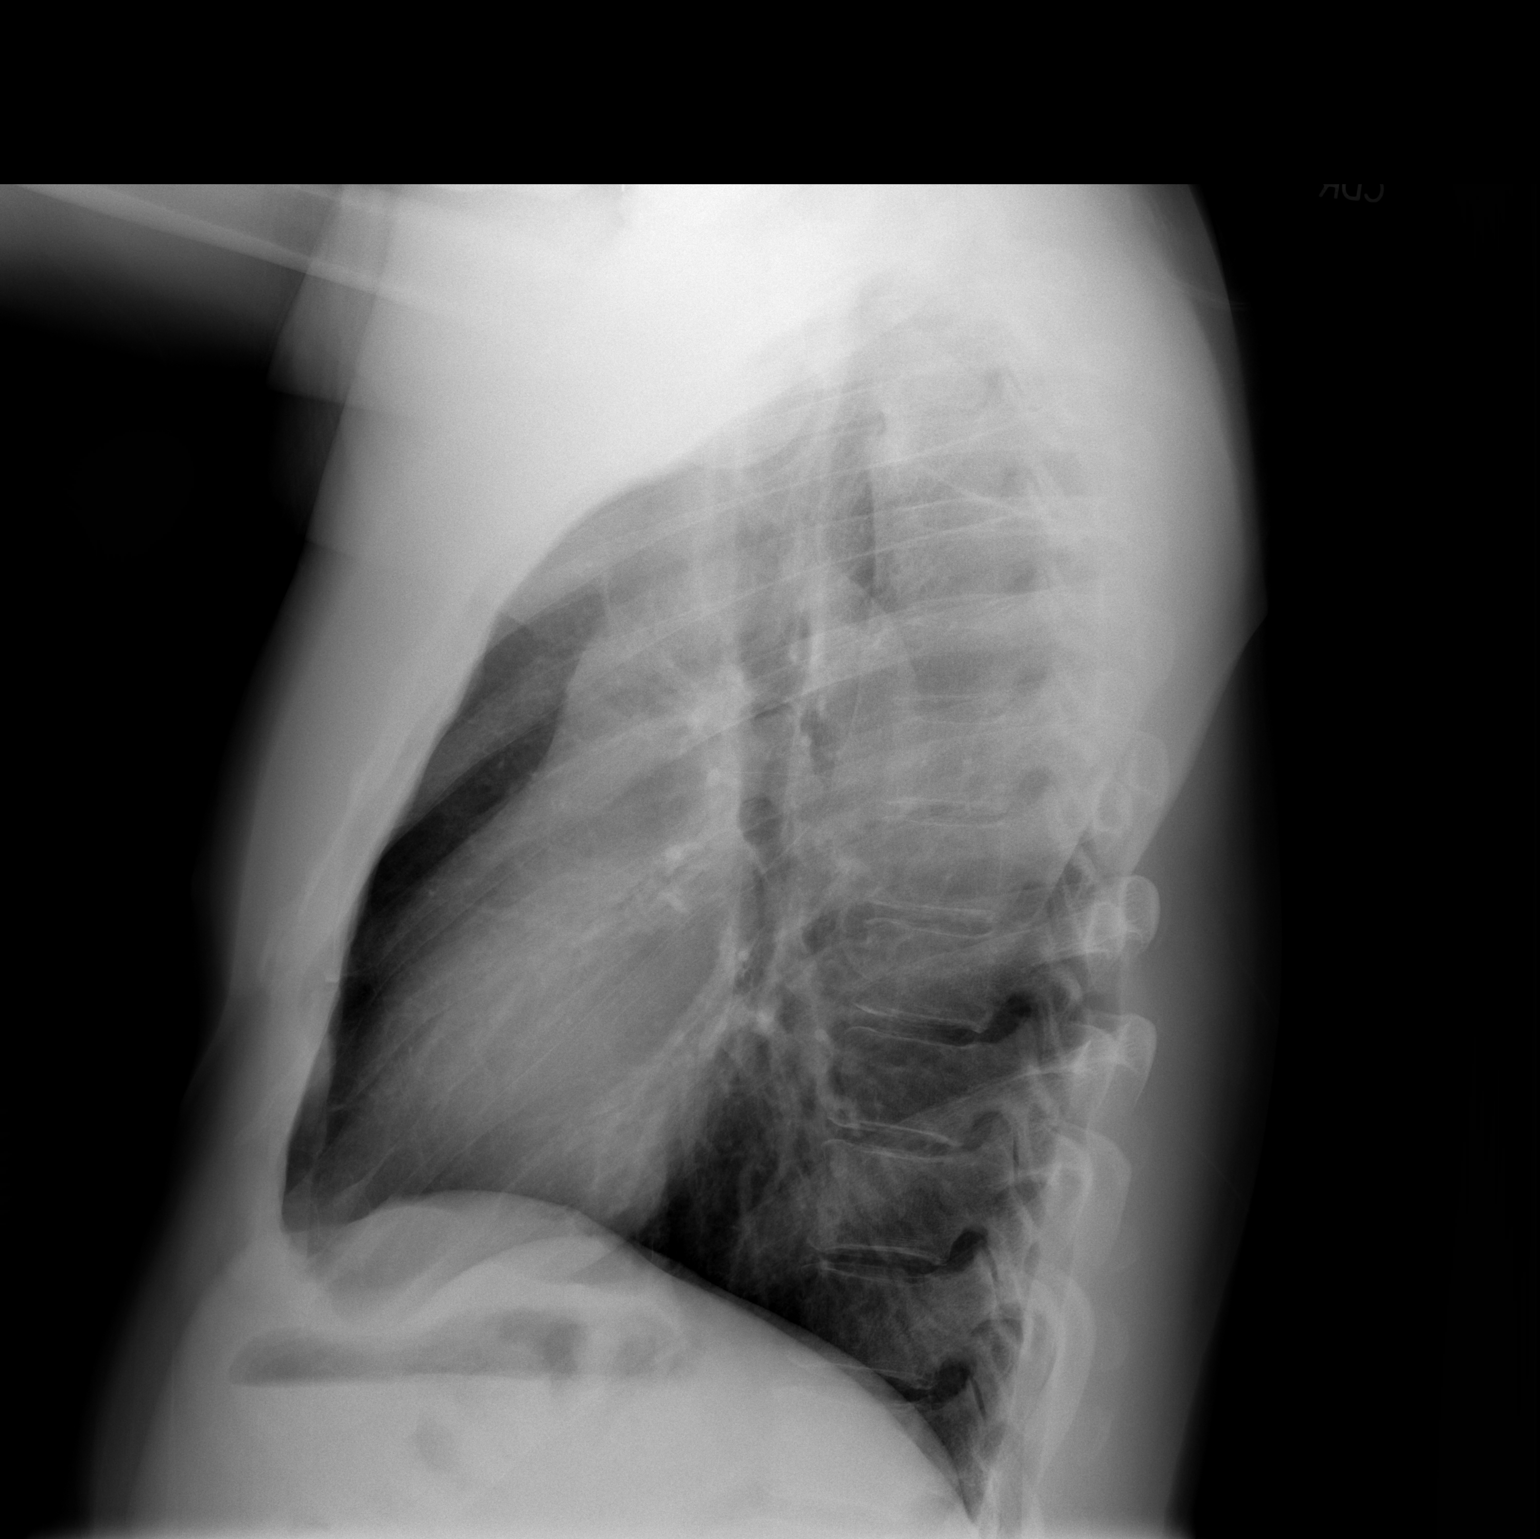

[2 of 2 positions shown; findings below may reference images not displayed]

FINDINGS: The heart size and mediastinal contours are within normal limits.
Both lungs are clear. The visualized skeletal structures are
unremarkable.
IMPRESSION: No active cardiopulmonary disease.
